# Patient Record
Sex: Male | Born: 1935 | Hispanic: Yes | Smoking: Former smoker
Health system: Southern US, Community
[De-identification: ages and names within clinical notes are randomized; demographics above are authoritative.]

## PROBLEM LIST (undated history)

## (undated) DIAGNOSIS — I10 Essential (primary) hypertension: Secondary | ICD-10-CM

## (undated) DIAGNOSIS — E785 Hyperlipidemia, unspecified: Secondary | ICD-10-CM

## (undated) DIAGNOSIS — I739 Peripheral vascular disease, unspecified: Secondary | ICD-10-CM

## (undated) DIAGNOSIS — Z8719 Personal history of other diseases of the digestive system: Secondary | ICD-10-CM

## (undated) HISTORY — PX: HERNIA REPAIR: SHX51

## (undated) HISTORY — DX: Essential (primary) hypertension: I10

## (undated) HISTORY — DX: Peripheral vascular disease, unspecified: I73.9

## (undated) HISTORY — PX: OTHER SURGICAL HISTORY: SHX169

## (undated) HISTORY — DX: Personal history of other diseases of the digestive system: Z87.19

## (undated) HISTORY — DX: Hyperlipidemia, unspecified: E78.5

---

## 2010-05-30 ENCOUNTER — Emergency Department: Payer: Self-pay | Admitting: Emergency Medicine

## 2011-10-16 ENCOUNTER — Ambulatory Visit: Payer: Self-pay | Admitting: Ophthalmology

## 2011-11-08 ENCOUNTER — Ambulatory Visit: Payer: Self-pay | Admitting: Vascular Surgery

## 2011-11-08 LAB — BASIC METABOLIC PANEL
Calcium, Total: 9.4 mg/dL (ref 8.5–10.1)
Chloride: 104 mmol/L (ref 98–107)
Co2: 23 mmol/L (ref 21–32)
EGFR (African American): 52 — ABNORMAL LOW
EGFR (Non-African Amer.): 45 — ABNORMAL LOW
Glucose: 99 mg/dL (ref 65–99)
Osmolality: 280 (ref 275–301)
Potassium: 4.7 mmol/L (ref 3.5–5.1)
Sodium: 137 mmol/L (ref 136–145)

## 2011-11-08 LAB — CBC
HCT: 36.8 % — ABNORMAL LOW (ref 40.0–52.0)
MCV: 91 fL (ref 80–100)
RBC: 4.03 10*6/uL — ABNORMAL LOW (ref 4.40–5.90)
RDW: 14 % (ref 11.5–14.5)
WBC: 6.4 10*3/uL (ref 3.8–10.6)

## 2011-11-12 ENCOUNTER — Ambulatory Visit: Payer: Self-pay | Admitting: Vascular Surgery

## 2011-11-14 ENCOUNTER — Inpatient Hospital Stay: Payer: Self-pay | Admitting: Physician Assistant

## 2011-11-15 LAB — CBC WITH DIFFERENTIAL/PLATELET
Basophil #: 0 10*3/uL (ref 0.0–0.1)
Basophil %: 0.6 %
Eosinophil %: 1.1 %
HCT: 30.1 % — ABNORMAL LOW (ref 40.0–52.0)
Lymphocyte #: 0.8 10*3/uL — ABNORMAL LOW (ref 1.0–3.6)
Lymphocyte %: 12.8 %
MCV: 92 fL (ref 80–100)
Monocyte %: 12.6 %
Neutrophil %: 72.9 %
Platelet: 140 10*3/uL — ABNORMAL LOW (ref 150–440)
RBC: 3.28 10*6/uL — ABNORMAL LOW (ref 4.40–5.90)
RDW: 13.8 % (ref 11.5–14.5)
WBC: 6.1 10*3/uL (ref 3.8–10.6)

## 2011-11-15 LAB — BASIC METABOLIC PANEL
BUN: 14 mg/dL (ref 7–18)
Calcium, Total: 8.4 mg/dL — ABNORMAL LOW (ref 8.5–10.1)
Creatinine: 1.17 mg/dL (ref 0.60–1.30)
EGFR (African American): >60
EGFR (Non-African Amer.): 59 — ABNORMAL LOW
Glucose: 123 mg/dL — ABNORMAL HIGH (ref 65–99)
Osmolality: 279 (ref 275–301)
Potassium: 4.3 mmol/L (ref 3.5–5.1)
Sodium: 139 mmol/L (ref 136–145)

## 2011-11-15 LAB — APTT: Activated PTT: 38.3 secs — ABNORMAL HIGH (ref 23.6–35.9)

## 2011-11-15 LAB — PATHOLOGY REPORT

## 2011-11-15 LAB — PROTIME-INR: Prothrombin Time: 15.5 secs — ABNORMAL HIGH (ref 11.5–14.7)

## 2012-06-04 ENCOUNTER — Ambulatory Visit: Payer: Self-pay | Admitting: Family Medicine

## 2012-12-13 ENCOUNTER — Emergency Department: Payer: Self-pay | Admitting: Internal Medicine

## 2012-12-13 LAB — COMPREHENSIVE METABOLIC PANEL
Anion Gap: 6 — ABNORMAL LOW (ref 7–16)
BUN: 11 mg/dL (ref 7–18)
Bilirubin,Total: 0.6 mg/dL (ref 0.2–1.0)
Calcium, Total: 8.9 mg/dL (ref 8.5–10.1)
Co2: 24 mmol/L (ref 21–32)
Creatinine: 1.25 mg/dL (ref 0.60–1.30)
EGFR (African American): >60
Glucose: 103 mg/dL — ABNORMAL HIGH (ref 65–99)
SGOT(AST): 47 U/L — ABNORMAL HIGH (ref 15–37)
SGPT (ALT): 50 U/L (ref 12–78)
Sodium: 136 mmol/L (ref 136–145)

## 2012-12-13 LAB — CBC
HGB: 13 g/dL (ref 13.0–18.0)
MCH: 30.1 pg (ref 26.0–34.0)
MCHC: 33.6 g/dL (ref 32.0–36.0)
MCV: 90 fL (ref 80–100)
Platelet: 127 10*3/uL — ABNORMAL LOW (ref 150–440)
RBC: 4.3 10*6/uL — ABNORMAL LOW (ref 4.40–5.90)

## 2012-12-13 LAB — TROPONIN I: Troponin-I: <0.02 ng/mL

## 2013-02-23 ENCOUNTER — Inpatient Hospital Stay: Payer: Self-pay | Admitting: Vascular Surgery

## 2013-02-23 LAB — CBC WITH DIFFERENTIAL/PLATELET
Basophil #: 0.1 10*3/uL (ref 0.0–0.1)
Basophil %: 1.1 %
Basophil %: 1 %
Eosinophil #: 0.1 10*3/uL (ref 0.0–0.7)
Eosinophil #: 0.1 10*3/uL (ref 0.0–0.7)
Eosinophil %: 1.8 %
Eosinophil %: 1.7 %
HCT: 37.9 % — ABNORMAL LOW (ref 40.0–52.0)
HCT: 34.8 % — ABNORMAL LOW (ref 40.0–52.0)
HGB: 12.7 g/dL — ABNORMAL LOW (ref 13.0–18.0)
Lymphocyte #: 0.8 10*3/uL — ABNORMAL LOW (ref 1.0–3.6)
Lymphocyte %: 15.8 %
MCHC: 33.6 g/dL (ref 32.0–36.0)
MCV: 91 fL (ref 80–100)
MCV: 91 fL (ref 80–100)
Monocyte #: 0.7 x10 3/mm (ref 0.2–1.0)
Monocyte #: 0.5 x10 3/mm (ref 0.2–1.0)
Monocyte %: 11.9 %
Monocyte %: 7.9 %
Neutrophil #: 4.9 10*3/uL (ref 1.4–6.5)
Neutrophil %: 76.7 %
RBC: 3.82 10*6/uL — ABNORMAL LOW (ref 4.40–5.90)
RDW: 15.4 % — ABNORMAL HIGH (ref 11.5–14.5)
WBC: 6.4 10*3/uL (ref 3.8–10.6)

## 2013-02-23 LAB — BASIC METABOLIC PANEL
Anion Gap: 6 — ABNORMAL LOW (ref 7–16)
BUN: 23 mg/dL — ABNORMAL HIGH (ref 7–18)
Calcium, Total: 9.2 mg/dL (ref 8.5–10.1)
Co2: 24 mmol/L (ref 21–32)
Creatinine: 1.4 mg/dL — ABNORMAL HIGH (ref 0.60–1.30)
EGFR (Non-African Amer.): 47 — ABNORMAL LOW
Glucose: 104 mg/dL — ABNORMAL HIGH (ref 65–99)
Osmolality: 278 (ref 275–301)
Sodium: 137 mmol/L (ref 136–145)

## 2013-02-23 LAB — PROTIME-INR
INR: 1.2
Prothrombin Time: 15.3 secs — ABNORMAL HIGH (ref 11.5–14.7)

## 2013-02-23 LAB — FIBRINOGEN: Fibrinogen: 490 mg/dL — ABNORMAL HIGH (ref 210–470)

## 2013-02-23 LAB — APTT: Activated PTT: 37.7 secs — ABNORMAL HIGH (ref 23.6–35.9)

## 2013-02-24 LAB — CBC WITH DIFFERENTIAL/PLATELET
Basophil #: 0 10*3/uL (ref 0.0–0.1)
Basophil #: 0 10*3/uL (ref 0.0–0.1)
Basophil %: 0.6 %
Basophil %: 0.6 %
Eosinophil #: 0 10*3/uL (ref 0.0–0.7)
Eosinophil #: 0.1 10*3/uL (ref 0.0–0.7)
Eosinophil %: 1.2 %
HCT: 33.4 % — ABNORMAL LOW (ref 40.0–52.0)
HCT: 33 % — ABNORMAL LOW (ref 40.0–52.0)
Lymphocyte #: 0.5 10*3/uL — ABNORMAL LOW (ref 1.0–3.6)
Lymphocyte #: 0.7 10*3/uL — ABNORMAL LOW (ref 1.0–3.6)
Lymphocyte %: 8.3 %
MCH: 30.7 pg (ref 26.0–34.0)
MCHC: 34.3 g/dL (ref 32.0–36.0)
MCV: 91 fL (ref 80–100)
Monocyte #: 0.5 x10 3/mm (ref 0.2–1.0)
Monocyte %: 7.3 %
Neutrophil #: 5 10*3/uL (ref 1.4–6.5)
Neutrophil #: 5.1 10*3/uL (ref 1.4–6.5)
Neutrophil %: 83.3 %
Platelet: 116 10*3/uL — ABNORMAL LOW (ref 150–440)
RBC: 3.68 10*6/uL — ABNORMAL LOW (ref 4.40–5.90)
RBC: 3.69 10*6/uL — ABNORMAL LOW (ref 4.40–5.90)
RDW: 15.2 % — ABNORMAL HIGH (ref 11.5–14.5)
WBC: 6.1 10*3/uL (ref 3.8–10.6)

## 2013-02-24 LAB — BASIC METABOLIC PANEL
BUN: 20 mg/dL — ABNORMAL HIGH (ref 7–18)
Calcium, Total: 8.3 mg/dL — ABNORMAL LOW (ref 8.5–10.1)
Co2: 23 mmol/L (ref 21–32)
Creatinine: 1.13 mg/dL (ref 0.60–1.30)
EGFR (Non-African Amer.): >60
Glucose: 124 mg/dL — ABNORMAL HIGH (ref 65–99)

## 2013-02-24 LAB — HEMOGLOBIN
HGB: 10.3 g/dL — ABNORMAL LOW (ref 13.0–18.0)
HGB: 9.9 g/dL — ABNORMAL LOW (ref 13.0–18.0)

## 2013-02-25 LAB — CBC WITH DIFFERENTIAL/PLATELET
Basophil #: 0 10*3/uL (ref 0.0–0.1)
Basophil %: 0.6 %
Eosinophil #: 0.1 10*3/uL (ref 0.0–0.7)
HGB: 9.5 g/dL — ABNORMAL LOW (ref 13.0–18.0)
Lymphocyte %: 14.2 %
MCH: 30.7 pg (ref 26.0–34.0)
MCHC: 34.2 g/dL (ref 32.0–36.0)
Monocyte #: 0.7 x10 3/mm (ref 0.2–1.0)
Neutrophil %: 72.2 %
Platelet: 102 10*3/uL — ABNORMAL LOW (ref 150–440)

## 2013-02-25 LAB — BASIC METABOLIC PANEL
Anion Gap: 6 — ABNORMAL LOW (ref 7–16)
BUN: 16 mg/dL (ref 7–18)
Chloride: 107 mmol/L (ref 98–107)
Co2: 24 mmol/L (ref 21–32)
Creatinine: 1.14 mg/dL (ref 0.60–1.30)
EGFR (Non-African Amer.): 60 — ABNORMAL LOW
Glucose: 101 mg/dL — ABNORMAL HIGH (ref 65–99)
Osmolality: 275 (ref 275–301)
Sodium: 137 mmol/L (ref 136–145)

## 2013-02-25 LAB — HEMOGLOBIN
HGB: 9.5 g/dL — ABNORMAL LOW (ref 13.0–18.0)
HGB: 9.4 g/dL — ABNORMAL LOW (ref 13.0–18.0)

## 2013-02-26 LAB — BASIC METABOLIC PANEL
Anion Gap: 4 — ABNORMAL LOW (ref 7–16)
Calcium, Total: 8.2 mg/dL — ABNORMAL LOW (ref 8.5–10.1)
EGFR (African American): 59 — ABNORMAL LOW
Glucose: 115 mg/dL — ABNORMAL HIGH (ref 65–99)
Osmolality: 274 (ref 275–301)
Potassium: 4 mmol/L (ref 3.5–5.1)
Sodium: 135 mmol/L — ABNORMAL LOW (ref 136–145)

## 2013-02-26 LAB — CBC WITH DIFFERENTIAL/PLATELET
Basophil %: 0.8 %
Eosinophil %: 3 %
HCT: 25.6 % — ABNORMAL LOW (ref 40.0–52.0)
HGB: 8.7 g/dL — ABNORMAL LOW (ref 13.0–18.0)
Lymphocyte #: 1.1 10*3/uL (ref 1.0–3.6)
Lymphocyte %: 17.8 %
MCH: 30.7 pg (ref 26.0–34.0)
MCHC: 34 g/dL (ref 32.0–36.0)
MCV: 90 fL (ref 80–100)
Monocyte #: 0.8 x10 3/mm (ref 0.2–1.0)
Monocyte %: 12 %
Neutrophil %: 66.4 %
Platelet: 110 10*3/uL — ABNORMAL LOW (ref 150–440)
RBC: 2.83 10*6/uL — ABNORMAL LOW (ref 4.40–5.90)
RDW: 15.5 % — ABNORMAL HIGH (ref 11.5–14.5)
WBC: 6.3 10*3/uL (ref 3.8–10.6)

## 2014-02-28 ENCOUNTER — Ambulatory Visit: Payer: Self-pay

## 2014-02-28 ENCOUNTER — Inpatient Hospital Stay: Payer: Self-pay | Admitting: Family Medicine

## 2014-02-28 LAB — PROTIME-INR
INR: 2.6
Prothrombin Time: 26.8 secs — ABNORMAL HIGH (ref 11.5–14.7)

## 2014-02-28 LAB — COMPREHENSIVE METABOLIC PANEL
AST: 38 U/L — AB (ref 15–37)
Albumin: 3.7 g/dL (ref 3.4–5.0)
Alkaline Phosphatase: 145 U/L — ABNORMAL HIGH
Anion Gap: 11 (ref 7–16)
BUN: 39 mg/dL — ABNORMAL HIGH (ref 7–18)
Bilirubin,Total: 0.6 mg/dL (ref 0.2–1.0)
CHLORIDE: 105 mmol/L (ref 98–107)
CREATININE: 1.68 mg/dL — AB (ref 0.60–1.30)
Calcium, Total: 8.5 mg/dL (ref 8.5–10.1)
Co2: 23 mmol/L (ref 21–32)
EGFR (Non-African Amer.): 42 — ABNORMAL LOW
GFR CALC AF AMER: 51 — AB
Glucose: 115 mg/dL — ABNORMAL HIGH (ref 65–99)
Osmolality: 288 (ref 275–301)
POTASSIUM: 4 mmol/L (ref 3.5–5.1)
SGPT (ALT): 35 U/L
Sodium: 139 mmol/L (ref 136–145)
Total Protein: 8 g/dL (ref 6.4–8.2)

## 2014-02-28 LAB — CBC
HCT: 36.1 % — ABNORMAL LOW (ref 40.0–52.0)
HGB: 11.9 g/dL — AB (ref 13.0–18.0)
MCH: 30.3 pg (ref 26.0–34.0)
MCHC: 33 g/dL (ref 32.0–36.0)
MCV: 92 fL (ref 80–100)
Platelet: 186 10*3/uL (ref 150–440)
RBC: 3.93 10*6/uL — ABNORMAL LOW (ref 4.40–5.90)
RDW: 15.1 % — AB (ref 11.5–14.5)
WBC: 5.5 10*3/uL (ref 3.8–10.6)

## 2014-02-28 LAB — HEPARIN LEVEL (UNFRACTIONATED): Anti-Xa(Unfractionated): >1.1 IU/mL (ref 0.30–0.70)

## 2014-02-28 LAB — CK TOTAL AND CKMB (NOT AT ARMC)
CK, TOTAL: 162 U/L (ref 39–308)
CK-MB: 2.3 ng/mL (ref 0.5–3.6)

## 2014-02-28 LAB — APTT: Activated PTT: 51.1 secs — ABNORMAL HIGH (ref 23.6–35.9)

## 2014-02-28 LAB — PRO B NATRIURETIC PEPTIDE: B-Type Natriuretic Peptide: 373 pg/mL (ref 0–450)

## 2014-02-28 LAB — TROPONIN I: Troponin-I: <0.02 ng/mL

## 2014-03-01 LAB — BASIC METABOLIC PANEL
Anion Gap: 8 (ref 7–16)
BUN: 22 mg/dL — ABNORMAL HIGH (ref 7–18)
Calcium, Total: 8.6 mg/dL (ref 8.5–10.1)
Chloride: 110 mmol/L — ABNORMAL HIGH (ref 98–107)
Co2: 22 mmol/L (ref 21–32)
Creatinine: 1.19 mg/dL (ref 0.60–1.30)
EGFR (African American): >60
EGFR (Non-African Amer.): >60
Glucose: 109 mg/dL — ABNORMAL HIGH (ref 65–99)
Osmolality: 283 (ref 275–301)
Potassium: 4.3 mmol/L (ref 3.5–5.1)
Sodium: 140 mmol/L (ref 136–145)

## 2014-03-01 LAB — CBC WITH DIFFERENTIAL/PLATELET
BASOS ABS: 0 10*3/uL (ref 0.0–0.1)
BASOS PCT: 0.2 %
EOS PCT: 0.1 %
Eosinophil #: 0 10*3/uL (ref 0.0–0.7)
HCT: 37.3 % — ABNORMAL LOW (ref 40.0–52.0)
HGB: 12.3 g/dL — ABNORMAL LOW (ref 13.0–18.0)
LYMPHS PCT: 4.8 %
Lymphocyte #: 0.5 10*3/uL — ABNORMAL LOW (ref 1.0–3.6)
MCH: 30.3 pg (ref 26.0–34.0)
MCHC: 33.1 g/dL (ref 32.0–36.0)
MCV: 92 fL (ref 80–100)
Monocyte #: 0.9 x10 3/mm (ref 0.2–1.0)
Monocyte %: 9.5 %
NEUTROS PCT: 85.4 %
Neutrophil #: 8.1 10*3/uL — ABNORMAL HIGH (ref 1.4–6.5)
PLATELETS: 180 10*3/uL (ref 150–440)
RBC: 4.08 10*6/uL — AB (ref 4.40–5.90)
RDW: 15.1 % — ABNORMAL HIGH (ref 11.5–14.5)
WBC: 9.5 10*3/uL (ref 3.8–10.6)

## 2014-03-01 LAB — LIPID PANEL
Cholesterol: 117 mg/dL (ref 0–200)
HDL Cholesterol: 42 mg/dL (ref 40–60)
Ldl Cholesterol, Calc: 66 mg/dL (ref 0–100)
Triglycerides: 44 mg/dL (ref 0–200)
VLDL Cholesterol, Calc: 9 mg/dL (ref 5–40)

## 2014-03-01 LAB — HEMOGLOBIN: HGB: 12 g/dL — ABNORMAL LOW (ref 13.0–18.0)

## 2014-03-01 LAB — APTT
Activated PTT: 75.9 secs — ABNORMAL HIGH (ref 23.6–35.9)
Activated PTT: 151.1 secs — ABNORMAL HIGH (ref 23.6–35.9)

## 2014-03-01 LAB — PROTIME-INR
INR: 1.4
Prothrombin Time: 16.9 secs — ABNORMAL HIGH (ref 11.5–14.7)

## 2014-03-01 LAB — HEPARIN LEVEL (UNFRACTIONATED): Anti-Xa(Unfractionated): >1.1 IU/mL (ref 0.30–0.70)

## 2014-03-01 LAB — PLATELET COUNT: Platelet: 187 10*3/uL (ref 150–440)

## 2014-03-01 LAB — FIBRINOGEN: Fibrinogen: 403 mg/dL (ref 210–470)

## 2014-03-02 LAB — CBC WITH DIFFERENTIAL/PLATELET
BASOS PCT: 0.6 %
Basophil #: 0 10*3/uL (ref 0.0–0.1)
Basophil #: 0 10*3/uL (ref 0.0–0.1)
Basophil %: 0.6 %
Eosinophil #: 0 10*3/uL (ref 0.0–0.7)
Eosinophil #: 0.1 10*3/uL (ref 0.0–0.7)
Eosinophil %: 0.2 %
Eosinophil %: 0.7 %
HCT: 34.3 % — ABNORMAL LOW (ref 40.0–52.0)
HCT: 36.4 % — ABNORMAL LOW (ref 40.0–52.0)
HGB: 11.4 g/dL — ABNORMAL LOW (ref 13.0–18.0)
HGB: 11.7 g/dL — AB (ref 13.0–18.0)
LYMPHS PCT: 6.5 %
Lymphocyte #: 0.5 10*3/uL — ABNORMAL LOW (ref 1.0–3.6)
Lymphocyte #: 0.7 10*3/uL — ABNORMAL LOW (ref 1.0–3.6)
Lymphocyte %: 8.7 %
MCH: 30.2 pg (ref 26.0–34.0)
MCH: 29.8 pg (ref 26.0–34.0)
MCHC: 33.3 g/dL (ref 32.0–36.0)
MCHC: 32.2 g/dL (ref 32.0–36.0)
MCV: 91 fL (ref 80–100)
MCV: 92 fL (ref 80–100)
MONO ABS: 0.8 x10 3/mm (ref 0.2–1.0)
MONOS PCT: 10 %
Monocyte #: 0.8 x10 3/mm (ref 0.2–1.0)
Monocyte %: 10.2 %
NEUTROS PCT: 82.7 %
Neutrophil #: 6.7 10*3/uL — ABNORMAL HIGH (ref 1.4–6.5)
Neutrophil #: 6 10*3/uL (ref 1.4–6.5)
Neutrophil %: 79.8 %
PLATELETS: 145 10*3/uL — AB (ref 150–440)
Platelet: 151 10*3/uL (ref 150–440)
RBC: 3.78 10*6/uL — ABNORMAL LOW (ref 4.40–5.90)
RBC: 3.94 10*6/uL — AB (ref 4.40–5.90)
RDW: 15.2 % — AB (ref 11.5–14.5)
RDW: 15.2 % — AB (ref 11.5–14.5)
WBC: 8.1 10*3/uL (ref 3.8–10.6)
WBC: 7.5 10*3/uL (ref 3.8–10.6)

## 2014-03-02 LAB — BASIC METABOLIC PANEL
Anion Gap: 8 (ref 7–16)
BUN: 16 mg/dL (ref 7–18)
CREATININE: 1.09 mg/dL (ref 0.60–1.30)
Calcium, Total: 8.5 mg/dL (ref 8.5–10.1)
Chloride: 106 mmol/L (ref 98–107)
Co2: 23 mmol/L (ref 21–32)
EGFR (African American): >60
EGFR (Non-African Amer.): >60
Glucose: 117 mg/dL — ABNORMAL HIGH (ref 65–99)
Osmolality: 276 (ref 275–301)
Potassium: 4.2 mmol/L (ref 3.5–5.1)
SODIUM: 137 mmol/L (ref 136–145)

## 2014-03-03 LAB — CBC WITH DIFFERENTIAL/PLATELET
Basophil #: 0 10*3/uL (ref 0.0–0.1)
Basophil %: 0.8 %
EOS ABS: 0.1 10*3/uL (ref 0.0–0.7)
EOS PCT: 2 %
HCT: 30.7 % — ABNORMAL LOW (ref 40.0–52.0)
HGB: 10.1 g/dL — AB (ref 13.0–18.0)
Lymphocyte #: 0.8 10*3/uL — ABNORMAL LOW (ref 1.0–3.6)
Lymphocyte %: 13.4 %
MCH: 30.1 pg (ref 26.0–34.0)
MCHC: 33 g/dL (ref 32.0–36.0)
MCV: 91 fL (ref 80–100)
MONOS PCT: 14.1 %
Monocyte #: 0.9 x10 3/mm (ref 0.2–1.0)
Neutrophil #: 4.2 10*3/uL (ref 1.4–6.5)
Neutrophil %: 69.7 %
PLATELETS: 128 10*3/uL — AB (ref 150–440)
RBC: 3.36 10*6/uL — ABNORMAL LOW (ref 4.40–5.90)
RDW: 15.1 % — ABNORMAL HIGH (ref 11.5–14.5)
WBC: 6.1 10*3/uL (ref 3.8–10.6)

## 2014-03-03 LAB — BASIC METABOLIC PANEL
ANION GAP: 8 (ref 7–16)
BUN: 19 mg/dL — ABNORMAL HIGH (ref 7–18)
CHLORIDE: 113 mmol/L — AB (ref 98–107)
CO2: 21 mmol/L (ref 21–32)
Calcium, Total: 7.9 mg/dL — ABNORMAL LOW (ref 8.5–10.1)
Creatinine: 1.27 mg/dL (ref 0.60–1.30)
GFR CALC NON AF AMER: 58 — AB
GLUCOSE: 104 mg/dL — AB (ref 65–99)
Osmolality: 286 (ref 275–301)
POTASSIUM: 3.8 mmol/L (ref 3.5–5.1)
Sodium: 142 mmol/L (ref 136–145)

## 2014-07-11 ENCOUNTER — Emergency Department
Admit: 2014-07-11 | Disposition: A | Discharge status: Other Acute Inpatient Hospital | Payer: Self-pay | Admitting: Emergency Medicine

## 2014-07-11 LAB — CBC WITH DIFFERENTIAL/PLATELET
BASOS PCT: 0.3 %
Basophil #: 0 10*3/uL (ref 0.0–0.1)
EOS PCT: 0.3 %
Eosinophil #: 0 10*3/uL (ref 0.0–0.7)
HCT: 17 % — ABNORMAL LOW (ref 40.0–52.0)
HGB: 5.3 g/dL — ABNORMAL LOW (ref 13.0–18.0)
LYMPHS PCT: 18.3 %
Lymphocyte #: 1.4 10*3/uL (ref 1.0–3.6)
MCH: 29.4 pg (ref 26.0–34.0)
MCHC: 31.1 g/dL — ABNORMAL LOW (ref 32.0–36.0)
MCV: 94 fL (ref 80–100)
MONO ABS: 0.3 x10 3/mm (ref 0.2–1.0)
Monocyte %: 4.2 %
Neutrophil #: 5.9 10*3/uL (ref 1.4–6.5)
Neutrophil %: 76.9 %
Platelet: 145 10*3/uL — ABNORMAL LOW (ref 150–440)
RBC: 1.8 10*6/uL — ABNORMAL LOW (ref 4.40–5.90)
RDW: 15.6 % — AB (ref 11.5–14.5)
WBC: 7.6 10*3/uL (ref 3.8–10.6)

## 2014-07-11 LAB — URINALYSIS, COMPLETE
Bacteria: NONE SEEN
Bilirubin,UR: NEGATIVE
GLUCOSE, UR: NEGATIVE mg/dL (ref 0–75)
Hyaline Cast: 2
Ketone: NEGATIVE
Leukocyte Esterase: NEGATIVE
Nitrite: NEGATIVE
PH: 5 (ref 4.5–8.0)
Protein: NEGATIVE
Specific Gravity: 1.01 (ref 1.003–1.030)
WBC UR: 8 /HPF (ref 0–5)

## 2014-07-11 LAB — COMPREHENSIVE METABOLIC PANEL
ALBUMIN: 3 g/dL — AB
ALK PHOS: 61 U/L
AST: 38 U/L
Anion Gap: 12 (ref 7–16)
BILIRUBIN TOTAL: 0.4 mg/dL
BUN: 91 mg/dL — AB
CREATININE: 3.11 mg/dL — AB
Calcium, Total: 8.3 mg/dL — ABNORMAL LOW
Chloride: 110 mmol/L
Co2: 11 mmol/L — ABNORMAL LOW
EGFR (African American): 21 — ABNORMAL LOW
EGFR (Non-African Amer.): 18 — ABNORMAL LOW
Glucose: 256 mg/dL — ABNORMAL HIGH
Potassium: 4.8 mmol/L
SGPT (ALT): 15 U/L — ABNORMAL LOW
Sodium: 133 mmol/L — ABNORMAL LOW
TOTAL PROTEIN: 5.7 g/dL — AB

## 2014-07-11 LAB — PROTIME-INR
INR: 1.5
Prothrombin Time: 18.6 secs — ABNORMAL HIGH

## 2014-07-11 LAB — APTT: Activated PTT: 31.3 secs (ref 23.6–35.9)

## 2014-07-11 LAB — TROPONIN I: Troponin-I: 0.07 ng/mL — ABNORMAL HIGH

## 2014-07-27 NOTE — Op Note (Signed)
PATIENT NAME:  Devin Cooley, Devin Cooley MR#:  [id number] DATE OF BIRTH:  05-14-1929  DATE OF PROCEDURE:  11/14/2011  PREOPERATIVE DIAGNOSES:  1. High-grade left carotid artery stenosis.  2. Amaurosis fugax.  3. Hypertension.   POSTOPERATIVE DIAGNOSES:  1. High-grade left carotid artery stenosis.  2. Amaurosis fugax.  3. Hypertension.   PROCEDURES: Left carotid endarterectomy with arterial reconstruction using the CoreMatrix system.   SURGEON: Annice Needy, M.D.   ANESTHESIA: General.   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  He was found to have a very high-grade left carotid artery stenosis in the 90 plus percent range and it was recommended he have revascularization for repair. Risks and benefits were discussed and informed consent was obtained.   DESCRIPTION OF PROCEDURE: The patient was brought to the operative suite and, after an adequate level of general anesthesia was obtained, a roll was placed under the shoulders and the neck was flexed and turned the side. His neck and chest were sterilely prepped and draped and a sterile surgical field was created, and he was placed in the modified beach chair position. An incision was created along the anterior border of the sternocleidomastoid and we dissected down through platysma with electrocautery. The sternocleidomastoid was retracted laterally and the facial vein was identified, ligated, and divided between silk ties. Our preoperative imaging demonstrated a very long lesion with the tightest portion of the stenosis in the common carotid artery well below the bifurcation, but with disease and stenosis that progressed a few centimeters above the carotid bifurcation. The artery was dissected out and encircled with vessel loops proximally and distally. The external carotid artery and  superior thyroid artery were dissected out. The patient was given 6000 units of intravenous heparin for systemic anticoagulation. Control was pulled up on the vessel loops. An anterior wall arteriotomy was created with an 11 blade and extended with Potts scissors. A Pruitt-Inahara shunt was placed first in the internal carotid artery, flushed and deaired, and then in the common carotid artery, and flow was restored in approximately two minutes. The endarterectomy was performed in the typical fashion. A proximal endpoint was cut flush with tenotomy scissors. An eversion endarterectomy was performed on the external carotid artery and a distal endpoint was created with a nice feathered endpoint with gentle traction. Three 7-0 Prolene tacking sutures were used to tack down the distal endpoint. The vessel was copiously heparinized locally. The CoreMatrix extracellular matrix was selected to reconstruct the arterial defect. This was cut and beveled distally and a 6-0 Prolene suture was started at the distal endpoint and run approximately one half the length of the arteriotomy. The CoreMatrix was then cut and beveled proximally which required almost the entirety of the graft due to the long nature of the lesion. A second 6-0 Prolene was started at the proximal endpoint and the medial suture line was tied together. The lateral suture line was run to approximately one-quarter the length of the arteriotomy and the Pruitt-Inahara shunt was then removed. The vessel was flushed and deaired from the external, internal and common carotid arteries and then the arteriotomy was completed. The vessel was flushed in the external carotid artery for several cardiac cycles prior to release of control. On release of control, several 6-0 Prolene patch sutures were used for hemostasis and hemostasis was achieved. The wound was  irrigated. Surgicel and Evicyl topical hemostatic agents were placed and hemostasis was achieved. The wound was then  closed with four interrupted 3-0 Vicryl sutures in the sternocleidomastoid space. The platysma was closed with a running 3-0 Vicryl, and the skin was closed with a 4-0 Monocryl. A sterile dressing was placed. The patient was then awakened from anesthesia and taken to the recovery room in stable condition.  ____________________________ Annice NeedyJason S. Dessie Delcarlo, MD jsd:slb D: 11/14/2011 11:02:18 ET     T: 11/14/2011 11:26:01 ET        JOB#: [id number] cc: Annice NeedyJason S. Devin Ziemba, MD, <Dictator> Devin IvanKanhka Linthavong, MD Annice NeedyJASON S Devin Gawlik MD ELECTRONICALLY SIGNED 11/19/2011 9:04

## 2014-07-30 NOTE — Op Note (Signed)
PATIENT NAME:  Devin Cooley, Devin Cooley MR#:  [id number] DATE OF BIRTH:  1929/12/07  DATE OF PROCEDURE:  02/24/2013  PREOPERATIVE DIAGNOSES: 1. Ischemia of left lower extremity.  2. Thrombosis of left common femoral artery, profunda femoris artery and a left femoral popliteal bypass graft.   POSTOPERATIVE DIAGNOSES:  1. Ischemia of left lower extremity.  2. Thrombosis of left common femoral artery, profunda femoris artery and a left femoral popliteal bypass graft.   PROCEDURE  PERFORMED:  1. Follow-up angiography existing lysis catheter.  2. Additional third order catheter placement into posterior tibial artery on the left.  3. Additional third order catheter placement into profunda femoris artery on the left.   4. Percutaneous transluminal angioplasty to 3 mm, left posterior tibial and tibioperoneal trunk.  5. Percutaneous transluminal angioplasty to 4 mm distal anastomosis left femoral popliteal bypass graft.  6. Percutaneous transluminal angioplasty of the left profunda femoris artery to 6 mm using a Lutonix balloon.  7. Mechanical thrombectomy using the AngioJet, left femoral popliteal bypass graft.  8. StarClose closure, right common femoral artery.   SURGEON: Levora Dredge, MD   SEDATION: Versed 5 mg plus fentanyl 200 mcg administered IV. Continuous ECG, pulse oximetry and cardiopulmonary monitoring is performed throughout the entire procedure by the interventional radiology nurse. Total sedation time was one hour, 30 minutes.   ACCESS: Existing 6 French sheath, right common femoral artery.   FLUOROSCOPY TIME: 9.6 minutes.   CONTRAST USED: Isovue 50 mL.   INDICATIONS: [name] is an 81 year old gentleman who presents to the Emergency Room with a cold leg which been ongoing for several days. Why the patient did not come to the Emergency Room or contact the office when his leg became acutely painful several days ago is unknown to me. Risks and benefits for attempted limb salvage to  prevent amputation were discussed with the patient. He has agreed to proceed. Lysis therapy was initiated yesterday. He now returns to the special procedure suite for follow-up angiography and intervention.   DESCRIPTION OF PROCEDURE: The patient is positioned supine on the table and after adequate sedation his right groin and the catheter system was prepped and draped in a sterile fashion. Appropriate timeout is called.   Obturator wire is removed from the infusion catheter and a 260 Magic torque wire is advanced through the catheter. The catheter is removed. The sheath is positioned in the proximal common femoral and hand injection of contrast is utilized to demonstrate the common femoral as well as the femoral bifurcation and graft anastomosis in an LAO projection. This shows that the common femoral is now patent. There is a 75% to 80% stenosis within the main trunk of the profunda femoris. There is patency of the proximal anastomosis, but very slow flow through the graft itself.   The catheter is then advanced over the wire into the graft itself and distal runoff is obtained with selective injection of the graft. This demonstrates a 60% stenosis at the distal anastomosis. There is no visualized tibial vessel runoff. There is occlusion of the popliteal at the level of the tibial plateau.   A 150 straight slip catheter is then advanced over the Magic torque wire and the Magic torque wire is removed after the catheter is positioned in the distal popliteal  and magnified images are obtained. These again, do not visualize any tibial vessel. A 260 stiff angled Glidewire is then advanced through the catheter and appears to course into the posterior tibial. Wire then is negotiated down  to the level of the ankle and the catheter is tracked down distally. Distally there appears to be significant diffuse occlusive disease within the posterior tibial, however, as the catheter is pulled back and small puffs of  contrast are made, the posterior tibial is patent down to its distal one third approximately one handbreadth above the malleolus is where the tibial disease appears most profound. There is also a high-grade stenosis at the origin of the posterior tibial and occlusion within the tibioperoneal trunk.   A 3 x 6 balloon is then advanced over an 018 wire which was introduced through the straight catheter and the catheter removed. Inflation is to 10 atmospheres for one minute. Following this, a 4 x 22 balloon is advanced beginning in the distal popliteal through the bypass graft, and, again inflation is to 10 atmospheres for one minute. Follow-up angiography demonstrates no flow through the graft at this time. Given the very poor outflow, I did not feel that further attempts at salvage were going to be helpful at least at the tibial level and turned my attention to the high-grade stricture in the profunda femoris, perhaps providing profunda inflow would allow for a stable limb.   KMP catheter was then advanced over the wire and the wire and catheter are negotiated into the profunda femoris. Steep oblique view of the left groin was then obtained in magnified images which highlighted the lesion. Subsequently, a 4 x 4 balloon was advanced through the lesion and inflated to 10 atmospheres for one minute. Follow-up angiography demonstrates significant residual stenosis, and a 6 x 6 Lutonix balloon was advanced across the profunda femoris lesion. Inflation was to 10 atmospheres for three full minutes. Follow-up angiography now demonstrates complete resolution of the profunda femoris lesion. There is actually such a nice match. There does not appear to be any residual stenosis and there is now dramatically improved flow into the profunda femoris.   Also of note, there was now very sluggish flow through the graft. I am uncertain as to why angioplasty of the profunda femoris would of had any effect; however, clearly, there  was some flow in the graft, although you never saw contrast reach the distal anastomosis. I therefore felt it was appropriate to attempt thrombectomy, perhaps some fresh clot had formed during the angioplasties, and therefore using a KMP catheter, the wire was negotiated into the graft and then down into the posterior tibial. The AngioJet was then prepped on the field, advanced over the wire and a suction aspiration was performed. Total of 190 mL was aspirated. Follow-up angiography from the common femoral now demonstrated flow throughout the entire graft with filling of the proximal popliteal. Several large collaterals  also filled. There is poor filling of the posterior tibial and tibial peroneal trunk. Nevertheless, having established some flow in the graft, I felt it appropriate to terminate the lytic therapy and have initiated Integrilin infusion for the next approximately 24 hours.   The sheath was then pulled into the right common femoral, oblique view obtained and a StarClose device deployed without difficulty. There were no immediate complications.   INTERPRETATION: Initial views now demonstrate that there is flow throughout the common femoral and profunda femoris. There is a high-grade stenosis greater than 75% to 80% within the profunda femoris proximally. There is very poor flow, which is quite sluggish through the graft itself. Distally, the graft appears to have a high-grade stenosis at its distal anastomosis, but more significantly the distal popliteal tibioperoneal trunk is not  visualized, as are all three tibials.   After taking catheter and wire down, the posterior tibial is identified. It does appear to be patent in its proximal two thirds. There does appear to be a high-grade stenosis within the tibioperoneal trunk and this is treated with angioplasty to 3 mm. The distal popliteal is treated with angioplasty 4 mm as is the distal anastomosis. Following a secondary mechanical thrombectomy  with the AngioJet there is patency, although it appears to be fairly compromised in its distal outflow.   Following angioplasty to maximum of 6 mm using a Lutonix balloon, there is wide patency with an excellent result of the profunda femoris.   SUMMARY: Successful recanalization of the common femoral and profunda femoris. There is now also flow in the femoral popliteal bypass graft but the outflow in the below-knee area is quite disadvantaged and I am doubtful that this will stay open. Nevertheless, given a good profunda femoris, he may have a viable limb. He will be maintained on Integrilin for the time being.   ____________________________ Renford DillsGregory G. Duffy Dantonio, MD ggs:sg D: 02/24/2013 09:48:48 ET T: 02/24/2013 10:13:16 ET JOB#: [id number]  cc: Renford DillsGregory G. Johntavius Shepard, MD, <Dictator> Renford DillsGREGORY G Macon Sandiford MD ELECTRONICALLY SIGNED 03/16/2013 17:17

## 2014-07-30 NOTE — Discharge Summary (Signed)
PATIENT NAME:  Devin Cooley, Ottavio MR#:  [id number] DATE OF BIRTH:  1929-05-14  DATE OF ADMISSION:  02/23/2013 DATE OF DISCHARGE:  02/26/2013  ADMITTING DIAGNOSES:  1.  Acute on chronic left lower extremity ischemia with rest pain.  2.  History of carotid artery disease. 3.  Hyperlipidemia. 4.  Hypertension.  DISCHARGE DIAGNOSES:  1.  Acute on chronic left lower extremity ischemia with rest pain.  2.  History of carotid artery disease. 3.  Hyperlipidemia. 4.  Hypertension.  HOSPITAL PROCEDURES:  1.  02/23/2013: Angiogram of the left leg with catheter-directed thrombolysis of tPA to left common femoral artery, femoral-popliteal bypass graft in popliteal artery, PTA distal anastomosis and popliteal artery, placement for overnight infusion of thrombolysis into left common femoral artery, femoral-to-popliteal bypass in the popliteal artery, placement of right jugular triple-lumen catheter.  2.  On 02/24/2013, angiogram was done with angioplasty to left posterior tibial and tibioperoneal trunk, distal anastomosis left femoral-popliteal bypass graft, left profunda femoris artery mechanical thrombectomy using AngioJet to the left femoral-popliteal bypass graft.   BRIEF HISTORY: The patient is an [age] year old male with history of left femoral-popliteal bypass and 2-day history of left lower extremity pain. He previously had claudication symptoms; however, this progressed to worsening left lower extremity pain. He presented with pulseless foot that was cool to touch. Two attempts at reperfusion, the procedures were performed after consent obtained.   HOSPITAL COURSE: The patient was admitted after thrombolysis and given overnight infusion of thrombolysis to the left lower extremity in the Critical Care Unit after angiogram on 02/23/2013. The following day, he was taken back to special procedures for second angiogram as listed above on 02/24/2013. He had significant improvement following this procedure to the  left lower extremity pain. He was on an Integrilin drip overnight, which was stopped the morning of 02/25/2013. Started to increase activity at this time. He was also started on Xarelto and aspirin was continued. The following day on 02/26/2013, we began ambulating more with assistance. He did have some difficulty initially; however, as the day went on he improved and was evaluated by physical therapy. He felt steady with a walker, and he lives with his son and daughter-in-law. He felt comfortable and ready to go home at this time.   DIET: Discharged on a low-fat, low-cholesterol diet.   ACTIVITY: No exertional activity.   MEDICATIONS: Hydrochlorothiazide, lisinopril, atorvastatin, Norco, Xarelto 20 mg daily and aspirin 81 mg daily.   FOLLOWUP: Home health will see. He was discharged with a walker. We will plan for a followup in 3 to 4 weeks. He was instructed to call or return in the interim with any issues or concerns.   ____________________________ Hoyle Sauerhelsea N. Demonte Dobratz, PA-C cnh:jcm D: 02/27/2013 11:19:37 ET T: 02/27/2013 11:43:25 ET JOB#: [id number]  cc: Hoyle Sauerhelsea N. Crickett Abbett, PA-C, <Dictator> Aerial Dilley N Rebekah Zackery PA ELECTRONICALLY SIGNED 03/11/2013 8:41

## 2014-07-30 NOTE — Consult Note (Signed)
CHIEF COMPLAINT and HISTORY:  Subjective/Chief Complaint left leg pain   History of Present Illness Patient presents with a two day history of left leg pain.  The pain has been severe since Saturday.  He had some pain with activity for a long time prior to this new pain.  No right leg symptoms.  No fever or heart palpitations.  In NSR today.  Does not have ulceration or infection. Has a long history of atherosclerosis present.  Had CEA in 2013.   PAST MEDICAL/SURGICAL HISTORY:  Past Medical History:   Atherosclerosis:    Hypercholesterolemia:    Kidney Insufficiency:    DVT:    HTN:    carotid endartectomy:   ALLERGIES:  Allergies:  No Known Allergies:   HOME MEDICATIONS:  Home Medications: Medication Instructions Status  hydrochlorothiazide-lisinopril 12.5 mg-20 mg oral tablet 1 tab(s) orally once a day Active  atorvastatin 40 mg oral tablet 1 tab(s) orally once a day (at bedtime) Active  Tylenol Caplet Extra Strength 500 mg oral tablet 2 tab(s) orally every 6 hours, As Needed Active   Family and Social History:  Family History Non-Contributory   Social History positive  tobacco (Current within 1 year), negative ETOH   Place of Living Home   Review of Systems:  Subjective/Chief Complaint left leg pain   Fever/Chills No   Cough No   Sputum No   Abdominal Pain No   Diarrhea No   Constipation No   Nausea/Vomiting No   SOB/DOE No   Chest Pain No   Telemetry Reviewed NSR   Dysuria No   Tolerating PT Yes   Tolerating Diet Yes   Medications/Allergies Reviewed Medications/Allergies reviewed   Physical Exam:  GEN well developed, well nourished   HEENT pink conjunctivae, poor dentition   NECK No masses  trachea midline   RESP normal resp effort  clear BS  no use of accessory muscles   CARD regular rate  no carotid bruits  no JVD   VASCULAR ACCESS none   ABD denies tenderness  normal BS   GU no superpubic tenderness   LYMPH positive  neck, negative axillae   EXTR negative edema, left foot cool to touch, cap refill sluggish on left.  Normal on right..  Right PT/DP pulses 1+, left pedal pulses not palpable.  Femoral pulse 2+ bilaterally.   SKIN No rashes, skin turgor good   NEURO cranial nerves intact, motor/sensory function intact   PSYCH alert, A+O to time, place, person   LABS:  Laboratory Results: Routine Hem:    17-Nov-14 13:52, CBC Profile  WBC (CBC) 5.9  RBC (CBC) 4.17  Hemoglobin (CBC) 12.7  Hematocrit (CBC) 37.9  Platelet Count (CBC) 173  MCV 91  MCH 30.5  MCHC 33.6  RDW 15.4  Neutrophil % 69.4  Lymphocyte % 15.8  Monocyte % 11.9  Eosinophil % 1.8  Basophil % 1.1  Neutrophil # 4.1  Lymphocyte # 0.9  Monocyte # 0.7  Eosinophil # 0.1  Basophil # 0.1  Result(s) reported on 23 Feb 2013 at 02:06PM.   RADIOLOGY:  Radiology Results: US:    06-Sep-14 15:17, US Color Flow Doppler Low Extrem Bilat (Legs)  US Color Flow Doppler Low Extrem Bilat (Legs)  REASON FOR EXAM:    hx dvt sob ; left leg pain  COMMENTS:       PROCEDURE: US  - US DOPPLER LOW EXTR BILATERAL  - Dec 13 2012  3:17PM     RESULT: Duplex Doppler interrogation  of the deep venous system of both   legs from the inguinal to the poplitealregion demonstrates the deep   venous systems are fully compressible throughout. The color Doppler and   spectral Doppler appearance is normal. There is normal response to distal   augmentation. The color Doppler images show no filling defect.    IMPRESSION:    1. No evidence of DVT in either lower extremity. The there is no   popliteal fossa cyst in either lower extremity.  Dictation Site: 6        Verified By: Elveria Royals, M.D., MD  LabUnknown:  PACS Image   ASSESSMENT AND PLAN:  Assessment/Admission Diagnosis left leg pain, pallor History of atherosclerotic disease Pain has been present for months, but worse over last 48 hours   Plan Suspect acute on chronic LLE ischemia with  rest pain. Foot is cool and discussed with patient and family that without revascularization limb loss is possible Plan to take to specials for angiogram with possible revascularization.   Anticoagulation can begin Patient and family voice their understanding and wish to proceed with attempt at revascularization   level 5   Electronic Signatures: Annice Needy (MD)  (Signed 17-Nov-14 14:15)  Authored: Chief Complaint and History, PAST MEDICAL/SURGICAL HISTORY, ALLERGIES, HOME MEDICATIONS, Family and Social History, Review of Systems, Physical Exam, LABS, RADIOLOGY, Assessment and Plan   Last Updated: 17-Nov-14 14:15 by Annice Needy (MD)

## 2014-07-30 NOTE — Op Note (Signed)
PATIENT NAME:  Devin Cooley, Devin Cooley MR#:  [id number] DATE OF BIRTH:  [birth year]  DATE OF PROCEDURE:  02/23/2013  PREOPERATIVE DIAGNOSES:  1.  Acute-on-chronic left lower extremity ischemia with rest pain. 2.  History of carotid artery disease.  3.  Hyperlipidemia.  4.  Hypertension.   POSTOPERATIVE DIAGNOSES:  1.  Acute-on-chronic left lower extremity ischemia with rest pain. 2.  History of carotid artery disease.  3.  Hyperlipidemia.  4.  Hypertension.  PROCEDURES:  1.  Catheter placement into left popliteal artery from right femoral approach.  2.  Aortogram and selective left lower extremity angiogram.  3.  Ultrasound guidance for vascular access, right femoral artery.  4.  Catheter-directed thrombolysis with 8 mg of tPA delivered with the AngioJet device to the left common femoral artery, femoral popliteal bypass graft, and popliteal artery.  5.  Percutaneous transluminal angioplasty of distal anastomosis and popliteal artery with 5 mm diameter angioplasty balloon.  6.  Placement for overnight infusion for thrombolysis into left common femoral artery, femoral to popliteal bypass and the popliteal artery.  7.  Ultrasound guidance for vascular access, right jugular vein.  8.  Placement of right jugular triple-lumen catheter, fluoroscopic guidance.   SURGEON: Annice Needy, M.D.   ANESTHESIA: Local with moderate conscious sedation.   ESTIMATED BLOOD LOSS: 25 mL.   INDICATION FOR PROCEDURE: This is an 81 year old Hispanic male with a 2-day history of left lower extremity pain. He has a history of femoral to popliteal bypass graft done at an outside institution 2 years ago. He had been having some claudication symptoms for weeks to months prior to his acute pain Saturday.   He presents with a pulseless foot which is cool and we are attempting to his perfusion to save his leg. The high risk of limb loss was discussed. Risks and benefits were discussed. Informed consent was obtained.    DESCRIPTION OF PROCEDURE: The patient is brought to the vascular  and interventional radiology suite. Groins were shaved and prepped and a sterile surgical field was created. The right femoral head was localized with fluoroscopy and the right femoral artery was visualized with ultrasound and accessed under direct ultrasound guidance without difficulty with a Seldinger needle. A J-wire and 5-French sheath were placed. Pigtail catheter was placed in the artery at the L1-L2 level and AP aortogram was performed. This showed patent renal arteries, patent aorta and iliac segments, although the flow in the left iliac artery was sluggish. I then crossed the aortic bifurcation and advanced to the left femoral head and selective left lower extremity angiogram was then performed. This showed occlusion of the left common femoral artery. No profunda flow was seen. The femoral popliteal bypass graft was occluded. The circumflex collateral provided minimal blood flow distally.   At this point, I passed a wire which easily traversed into the femoral popliteal bypass graft to gain access to the popliteal artery distally. Imaging showed some stenosis in the popliteal artery just below the bypass graft and nearing the thrombus at the distal anastomosis. I parked a wire into the tibials, heparinized the patient. I instilled 8 mg of tPA from the common femoral artery throughout the bypass graft and in the popliteal artery. This was allowed to dwell and a 5 mm diameter x 10 cm in length angioplasty balloon was inflated in the popliteal artery and the distal bypass graft including the anastomosis.   I elected to leave a lysis catheter in hopes that some perfusion into the profunda would also  be seen in addition to getting the femoral popliteal bypass graft opened, and a lytic catheter was parked in the proximal common femoral artery proximally with the distal tip in the popliteal artery. This was a 130 total length, 50 cm working  length, lysis catheter.   On the last image through the sheath and the distal external iliac artery, there was some flow going in the profunda femoris artery although thrombus was still present in the common femoral artery. At this point, both the sheath and the catheter were secured to the leg. I placed a central line for durable venous access and the patient receiving continuous thrombolysis.   The right neck and chest were sterilely prepped and draped and a sterile surgical field was created. The right jugular vein was visualized with ultrasound and found to be patent. It was accessed under direct ultrasound guidance without difficulty with Seldinger needle. J-wire was placed. After skin nick and dilatation, the central line was placed over the wire and the wire was removed. The catheter tip was parked in the right atrium. It withdrew blood and flushed easily with sterile saline, and all three lumens were secured at 20 cm in the neck and a sterile dressing was placed. The patient tolerated the procedure well and was taken to the recovery room in stable condition and he will be brought back tomorrow for imaging for attempt at revascularization of the left lower extremity.   ____________________________ Annice NeedyJason S. Shaelynn Dragos, MD jsd:np D: 02/23/2013 16:19:39 ET T: 02/23/2013 17:21:00 ET JOB#: [id number]  cc: Annice NeedyJason S. Nyana Haren, MD, <Dictator> Annice NeedyJASON S [place] MD ELECTRONICALLY SIGNED 03/16/2013 9:20

## 2014-07-31 NOTE — Consult Note (Signed)
Brief Consult Note: Diagnosis: left leg ischemia, acute on chronic.   Comments: acute on chronic left leg ischemia.  Patient had a similar episode 2 years ago.  He last took Xaralto yesterday at 12 noon.  His foot feels better today.  He has normal motor function in his foot.  Sensation is slightly diminished, but he says that is not new.  Will plan for angiogram tomorrow.  NPO after midnight.  Continue heparin.  Hold oral anticoagulants.  Electronic Signatures: Nada LibmanBrabham, Vance W (MD)  (Signed [phone]-Nov-15 09:26)  Authored: Brief Consult Note   Last Updated: 22-Nov-15 09:26 by Nada LibmanBrabham, Vance W (MD)

## 2014-07-31 NOTE — Op Note (Signed)
PATIENT NAME:  Devin Cooley, Aryan MR#:  [id number] DATE OF BIRTH:  1929/09/15  DATE OF PROCEDURE:  03/01/2014  PREOPERATIVE DIAGNOSIS: Acute on chronic left lower extremity ischemia with occluded femoral to popliteal bypass graft requiring thrombolysis.   POSTOPERATIVE DIAGNOSIS: Acute on chronic left lower extremity ischemia with occluded femoral to popliteal bypass graft requiring thrombolysis.   PROCEDURES: 1.  Ultrasound guidance for vascular access, right jugular vein.  2.  Fluoroscopic guidance for placement of catheter.  3.  Placement of a right jugular triple-lumen catheter.   SURGEON: Annice NeedyJason S. Ellenor Wisniewski, MD   ANESTHESIA: Local with sedation.   BLOOD LOSS: Minimal.   INDICATION FOR PROCEDURE: An 81 year old Hispanic male who was getting procedure to try to salvage his left leg. He has acute on chronic left lower extremity ischemia with occlusion of his femoral popliteal bypass graft. We are doing lysis to try to salvage this. He needs a good durable venous access so continuous tPA infusing as well as a heparin drip and will  need multiple blood draws and intravenous medications.   DESCRIPTION OF PROCEDURE: The patient's right neck was sterilely prepped and draped and a sterile surgical field was created. The jugular vein was visualized with ultrasound and found to be widely patent. It was then accessed with ultrasound guidance without difficulty with a Seldinger needle. A J-wire was then placed. As we were already in the fluoroscopy suite on the fluoroscopy table, I used fluoroscopy to guide the catheter over the wire and parked the tip at the cavoatrial junction. This was about 15-16 cm at the skin where it was secured with 3 silk sutures. All 3 lumens withdrew blood well and flushed easily with heparinized saline and a sterile dressing was placed. The patient tolerated the procedure well.    ____________________________ Annice NeedyJason S. Alie Moudy, MD jsd:LT D: 03/01/2014 16:18:56 ET T: 03/01/2014  16:34:29 ET JOB#: [id number]  cc: Annice NeedyJason S. Chassidy Layson, MD, <Dictator> Annice NeedyJASON S Lyric Rossano MD ELECTRONICALLY SIGNED 03/21/2014 11:41

## 2014-07-31 NOTE — Discharge Summary (Signed)
PATIENT NAME:  Devin Cooley, Devin Cooley MR#:  [id number] DATE OF BIRTH:  [birth year]   DATE OF ADMISSION: 02/28/2014.   DATE OF DISCHARGE: 03/03/2014.   DISCHARGE DIAGNOSES:  Severe peripheral arterial disease.   DISCHARGE MEDICATIONS: 1.  Hydrochlorothiazide/lisinopril 12.5/20 mg 1 tab p.o. daily.  2.  Atorvastatin 80 mg p.o. at bedtime.  3.  Aspirin 81 mg p.o. daily.  4.  Xarelto 20 mg p.o. daily.   CONSULTS: Vascular surgery, per Annice NeedyJason S. Dew, MD.   PROCEDURES: The patient had a vascular procedure performed on November 24, noted to have a bypass graft that was reopened, treated the profunda for rethrombosis and also found to have a 2 vessel runoff present.   PERTINENT LABS:  On day of discharge, sodium 142, potassium 3.8, creatinine 1.27, glucose 104. White blood cell count 6.1, hemoglobin 10.1, and platelets of 128,000.   BRIEF HOSPITAL COURSE:   Severe peripheral arterial disease. The patient initially came in with complaints of acute on chronic left foot pain with poor circulation, was seen by vascular surgery, underwent a procedure to reopen the vessel and he has perfused the lower extremities much better after that procedure.  He was evaluated by Dr. Wyn Quakerew.  He will continue on Xarelto, the aspirin and the statin control. Continue with home blood pressure medications. He is in stable condition to be discharged home and was able to ambulate prior to discharge.   FOLLOWUP:  He is going to  follow up with vascular surgery 1-2 weeks.    ____________________________ Marisue IvanKanhka Johnney Scarlata, MD kl:DT D: 03/03/2014 12:53:00 ET T: 03/03/2014 12:59:22 ET JOB#: [id number]  cc: Marisue IvanKanhka Lulia Schriner, MD, <Dictator> Marisue IvanKANHKA Marzella Miracle MD ELECTRONICALLY SIGNED 03/08/2014 8:59

## 2014-07-31 NOTE — Op Note (Signed)
PATIENT NAME:  Devin Cooley, Devin Cooley MR#:  [id number] DATE OF BIRTH:  1930/03/23  DATE OF PROCEDURE:  03/02/2014  PREOPERATIVE DIAGNOSES:  1.  Peripheral arterial disease with ischemic rest pain, left lower extremity, with occluded femoral to popliteal bypass graft, status post overnight thrombolysis.  2.  Small saccular abdominal aortic aneurysm.   POSTOPERATIVE DIAGNOSES: 1.  Peripheral arterial disease with ischemic rest pain, left lower extremity, with occluded femoral to popliteal bypass graft, status post overnight thrombolysis.  2.  Small saccular abdominal aortic aneurysm.   PROCEDURE PERFORMED:  1.  Left lower extremity angiogram through existing sheath and catheter.  2.  Percutaneous transluminal angioplasty of distal bypass anastomosis with 6 mm diameter higher-pressure angioplasty balloon.  3.  Percutaneous transluminal angioplasty of the profunda femoris artery for rethrombosis with 5 mm diameter angioplasty balloon.  4.  Percutaneous transluminal angioplasty of left external iliac artery with 7 mm diameter angioplasty balloon.  5.  StarClose closure device, right femoral artery.   SURGEON: Annice NeedyJason S. Emmaline Wahba, MD   ANESTHESIA: Local with monitored conscious sedation.   ESTIMATED BLOOD LOSS: Minimal.   CONTRAST USED: 50 mL Visipaque.   INDICATION FOR PROCEDURE: An 81 year old gentleman who was brought in yesterday and started on thrombolysis for an occluded femoral-to popliteal bypass graft. He returns today for a second look angiography for further evaluation and potential treatment. Risks and benefits were discussed. Informed consent was obtained.   DESCRIPTION OF PROCEDURE: The patient was brought to the vascular suite. Existing sheath, catheter, and groins were sterilely prepped and draped, and a sterile surgical field was created. The lytic catheter was removed. Advantage wire was placed into the tibial vessels. Imaging was then performed. This showed that the bypass graft did have  flow. The profunda had rethrombosed, and there was 2-vessel runoff distally with the anterior tibial artery being better. The distal bypass anastomosis still appeared to have a 50 or 60% stenosis, and I elected to treat this area with a 6 mm angioplasty balloon, but use a higher inflation pressure up to 15 atmospheres. Following this, the flow did appear improved, with only about a 30% residual stenosis that was not flow limiting. The wire was then taken out of the bypass graft and used to cannulate the profunda femoris artery, which was cannulated without difficulty. The profunda was treated with a 5 mm diameter angioplasty balloon in the occluded area in the proximal profunda femoris artery, with a good angiographic completion result and restored patency after angioplasty. There was still flow within the bypass graft, as well. In a magnified oblique projection I pulled the sheath back to the aortic bifurcation to evaluate for any inflow stenosis. There did appear to be some narrowing at the origin of the left external iliac artery that I did not appreciate on yesterday's films. This was not high-grade, but did appear to be in the 50-60% range and, when a 7 balloon was inflated across this area, a significant waist was seen, which resolved with angioplasty. Completion angiogram following the angioplasty showed only about a 20% residual stenosis and more brisk flow.   The sheath was then removed. StarClose closure device was deployed in the usual fashion with excellent hemostatic result.   The patient tolerated the procedure well and was taken to the recovery room in stable condition.     ____________________________ Annice NeedyJason S. Aston Lawhorn, MD jsd:MT D: 03/02/2014 08:26:33 ET T: 03/02/2014 10:23:34 ET JOB#: [id number]  cc: Annice NeedyJason S. Aryanne Gilleland, MD, <Dictator> Annice NeedyJASON S Brea Coleson MD ELECTRONICALLY SIGNED 03/21/2014  11:41 

## 2014-07-31 NOTE — Op Note (Signed)
PATIENT NAME:  Devin Cooley, Devin Cooley MR#:  [id number] DATE OF BIRTH:  [birth year]  DATE OF PROCEDURE:  03/01/2014  PREOPERATIVE DIAGNOSES:  1.  Acute on chronic left lower extremity ischemia.  2.  Occlusion of left femoral to popliteal bypass graft.   POSTOPERATIVE DIAGNOSES:  1.  Acute on chronic left lower extremity ischemia.  2.  Occlusion of left femoral to popliteal bypass graft.   PROCEDURES: 1.  Ultrasound guidance for vascular access to right femoral artery.  2.  Catheter placement to left anterior tibial artery and left posterior tibial artery from right femoral approach.  3.  Aortogram and selective left lower extremity angiogram.  4.  Catheter-directed thrombolysis with 8 mg of tPA to the left profunda femoris artery, left common femoral artery, femoral popliteal bypass, popliteal artery, tibioperoneal trunk and posterior tibial arteries.  5.  Mechanical rheolytic thrombectomy to left common femoral artery, femoral to popliteal bypass, popliteal artery, tibioperoneal trunk and posterior tibial arteries.  6.  Percutaneous transluminal angioplasty of left profunda femoris artery with 5 mm diameter drug-coated angioplasty balloon.  7.  Percutaneous transluminal angioplasty of proximal femoral-popliteal bypass anastomosis with 6 mm diameter high-pressure angioplasty balloon.  8.  Percutaneous transluminal angioplasty of distal femoral popliteal bypass anastomosis with 6 mm diameter Lutonix drug-coated angioplasty balloon.  9.  Percutaneous transluminal angioplasty of left posterior tibial artery and tibioperoneal trunk with 2.5 mm diameter angioplasty balloon.  10.  Percutaneous transluminal angioplasty of left anterior tibial artery with 3 mm diameter angioplasty balloon.  11.  Placement of 135 cm total length, 50 cm working length overnight infusion catheter throughout the femoral to popliteal bypass graft and into the distal popliteal artery.   SURGEON: Festus Barren, MD   ANESTHESIA: Local  with moderate conscious sedation.   BLOOD LOSS: 50 mL.   INDICATION FOR PROCEDURE: This is an 81 year old gentleman with acute left lower extremity ischemia, despite being on anticoagulation. He has a previous femoral popliteal bypass graft. This was salvaged about 1 year ago. He returns with similar symptoms and we are trying to salvage his leg. It is likely he will lose his leg if we are unable to open this and he is willing to take the risk to try to save his leg.   DESCRIPTION OF PROCEDURE: The patient is brought to the vascular suite. Groins were shaved and prepped and a sterile surgical field was created. The right femoral artery is visualized with ultrasound and found to be patent. It was accessed under direct ultrasound guidance without difficulty with a Seldinger needle. A J-wire and 5 French sheath were placed. Pigtail catheter was placed in the aorta at the L1-L2 level. An AP aortogram was performed and pelvic obliques performed. This demonstrates a saccular abdominal aortic aneurysm in the mid abdominal aorta. This is slightly larger than his study 1 year ago, but still appeared to be in the small to medium size range. The iliacs had mild to moderate disease, a little worse on the right than the left. I then crossed the aortic bifurcation and advanced to the left femoral head and selective left lower extremity angiogram was then performed. This showed occlusion of the left common femoral artery, profunda femoris artery, superficial femoral artery and bypass graft. There was very poor flow initially. I placed a 6 [place] Ansell sheath over a Terumo advantage wire and gave the patient 5000 units of intravenous heparin. I then initially crossed the profunda femoris occlusion. Imaging was performed which showed thrombosis at this area. I used  about 2 mg of tPA instilled through the AngioJet Omni catheter in the common femoral artery and profunda femoris artery and then performed balloon angioplasty with  a 5 mm diameter Lutonix drug-coated angioplasty balloon to the profunda femoris artery. This resulted in some restored flow, although there was still some significant thrombus in the common femoral artery causing flow limitation. I then turned my attention to trying to get the bypass graft open. With a Kumpe catheter and advantage wire, I was able to gain access to the occluded bypass graft. I crossed the occluded bypass graft and confirmed intraluminal flow in the popliteal artery. There was very poor runoff distally. The anterior tibial artery had multiple areas of greater than 50% stenosis, but was the best runoff. Its origin had a high-grade stenosis that appeared to be from thrombus in the posterior tibial artery. The posterior tibial artery and tibioperoneal trunk were occluded and this was his best runoff a year ago. I got down into the tibioperoneal trunk and posterior tibial artery and was able to get a wire all the way into the foot. Then 6 mg of tPA was instilled throughout the common femoral artery, femoral to popliteal bypass graft, popliteal artery, tibioperoneal trunk and proximal posterior tibial artery. This was allowed to dwell for approximately 15 minutes. Mechanical rheolytic thrombectomy was performed over the same vessels. This still showed very sluggish flow with what appeared to be thrombus and stenosis at the proximal bypass anastomosis. This was treated with a 6 mm diameter high-pressure angioplasty balloon. There was now flow into the graft but the flow was very sluggish distally. Mechanical rheolytic thrombectomy was performed again. There was stenosis at the distal bypass stenosis that appeared near occlusive as well as thrombus. I treated this with a 5 mm diameter Lutonix drug-coated angioplasty balloon but thrombus still persisted. In addition, his runoff was very poor. To try to best treat this, I took a 2.5 mm diameter angioplasty balloon from the foot up through the tibioperoneal  trunk and the posterior tibial artery in its entirety and tried to treat this lesion. There was still very poor flow. I gained access to the anterior tibial artery and exchanged for an 0.018 wire as well with a 3 mm diameter angioplasty balloon to treat the 3 areas of moderate to high grade stenosis in the anterior tibial artery. This resulted in some improvement in flow, but there was still very sluggish flow. Mechanical rheolytic thrombectomy again was run in the distal bypass anastomosis, popliteal artery, tibioperoneal trunk area but flow remained sluggish. At this point, I felt our only hope of getting his bypass graft open was to run overnight thrombolysis to remove the thrombus. A 135 total length, 50 cm working length infusion catheter was then parked with its distal tip in the below-knee popliteal artery and its proximal tip in the common femoral artery just above the origin of the bypass graft. This was secured with a Prolene suture as was the sheath where heparin will be run through overnight. I then placed a central line which will be dictated separately and the patient was taken to the recovery room in stable condition.    ____________________________ Annice NeedyJason S. Saurav Crumble, MD jsd:TT D: 03/01/2014 16:27:57 ET T: 03/01/2014 16:45:13 ET JOB#: [id number]  cc: Annice NeedyJason S. Naol Ontiveros, MD, <Dictator> Annice NeedyJASON S Glorine Hanratty MD ELECTRONICALLY SIGNED 03/21/2014 11:41

## 2014-07-31 NOTE — Consult Note (Signed)
PATIENT NAME:  Devin Cooley, Devin Cooley MR#:  [id number] DATE OF BIRTH:  February 29, 1930  DATE OF CONSULTATION:  02/28/2014  CONSULTING PHYSICIAN:  Nada LibmanVance W. Selena Swaminathan, MD  PRIMARY CARE PHYSICIAN: Marisue IvanKanhka Linthavong, [facility] at [facility].   ADMISSION DIAGNOSIS: Lower extremity ischemia.   HISTORY: This is an 81 year old, Hispanic gentleman who presented to the emergency department with complaints of left lower extremity pain which began at around 10:00. He stated that his left foot became cold and extremely painful 4 hours prior to admission. By the time he was evaluated in the emergency department, his foot was feeling better and was not as cold. It was reportedly blue at the initial episode, but has become more pink. The patient has a history of a left femoral popliteal bypass graft at an outside institution. He presented with similar complaints, however, by his description, much more severe in November of 2014. At that time he underwent catheter directed thrombolysis to the left common femoral, common femoral popliteal bypass graft. He also had angioplasty of the distal anastomosis and popliteal artery. The patient has been doing well since that time, although he does report numbness in that leg. He was evaluated within the several months at [facility] and was doing well.   Subjectively, the patient states that he is much better this morning. He is on Xarelto, which was discontinued. His last dose was Saturday around noon. He is on a heparin drip now. The patient suffers from hypercholesterolemia, which is managed with a statin. He has been on Xarelto for anticoagulation. He is also on dual medications including an ACE inhibitor for hypertension. The patient is a nonsmoker.   PAST MEDICAL HISTORY: Peripheral vascular disease, hypertension, hyperlipidemia.   PAST SURGICAL HISTORY: Thrombectomy and catheter-directed thrombolysis of the left leg in 2014. He also has had a femoral popliteal bypass graft  on the left. He is status post left carotid endarterectomy.   SOCIAL HISTORY: He lives with his son. Does not smoke or drink.   FAMILY HISTORY: Is negative for any significant cardiovascular disease.   MEDICATIONS: Aspirin 81 mg, atorvastatin 80, hydrochlorothiazide/lisinopril 12.5/20, Xarelto 20 mg.   ALLERGIES: None.   REVIEW OF SYSTEMS: Please see HPI, otherwise all systems are negative.   PHYSICAL EXAMINATION: VITAL SIGNS: Temperature is 98.2, pulse 81, respirations 20, blood pressure 196/98. Pulse oximetry 95% on room air.   GENERAL: He is well appearing, in no acute distress.   HEENT: Normocephalic, atraumatic.   CARDIOVASCULAR: Regular rate and rhythm. He has a palpable right femoral pulse. I cannot palpate a left femoral pulse, although I was able to get a Doppler signal. He does not have any Doppler signals in his left foot.   NEUROLOGIC: The patient has mildly decreased sensation in the left foot, but he has normal motor function.   EXTREMITIES: His bilateral feet are warm and well perfused.   ABDOMEN: Soft and nontender.   PULMONARY: Respirations are nonlabored.   PERTINENT LABORATORY VALUES: Creatinine is 1.68, white blood cell count is 5.5, hemoglobin is 11.9.   ASSESSMENT: Acute on chronic left lower extremity ischemia.   PLAN: The patient has had some form of decreased blood flow to his left leg, which caused him to have acute pain. Subsequently, he has improved. He does not have an ischemic leg at this time. The patient has been on Xarelto. His last dose was yesterday at noon. He should continue on IV heparin with plans for angiography to determine the most appropriate intervention. This is  a limb threatening situation. Most likely, the patient will proceed with angiography tomorrow or Tuesday depending on the timing of his last dose of Xarelto. This was discussed extensively with the patient and he wishes to proceed.     ____________________________ Nada Libman, MD vwb:TT D: 02/28/2014 15:59:04 ET T: 02/28/2014 16:19:24 ET JOB#: [id number]  cc: Nada Libman, MD, <Dictator> Marisue Ivan, MD Nada Libman MD ELECTRONICALLY SIGNED 02/28/2014 19:48

## 2014-07-31 NOTE — H&P (Signed)
PATIENT NAME:  Devin Cooley, [name] MR#:  [id number] DATE OF BIRTH:  1930/03/02  DATE OF ADMISSION:  02/28/2014  REFERRING PHYSICIAN: Cecille AmsterdamJonathan E. Mayford KnifeWilliams, M.D.   PRIMARY CARE DOCTOR: Marisue IvanKanhka Linthavong, M.D., [facility].   ADMISSION DIAGNOSIS: Lower extremity ischemia.   HISTORY OF PRESENT ILLNESS: This is an 81 year old, Hispanic male, who presents to the Emergency Department complaining of left lower extremity pain and stiffness. The patient states that his left foot became cold and extremely painful approximately 4 hours prior to admission. By the time of my examination in the Emergency Department, his foot was feeling much better and was no longer as cold. The color returned to his foot that the daughter-in-law reports was actually blue at one time. The patient has a past medical history significant for arterial procedures to that leg for the same complaints approximately 1 year ago to date. Due to symptoms of arterial occlusion, the Emergency Department called for admission.   REVIEW OF SYSTEMS:  CONSTITUTIONAL: The patient denies fever or weakness.  EYES: Denies inflammation or blurred vision.  ENT: Denies tinnitus or sore throat.  RESPIRATORY: Denies cough or shortness of breath.  CARDIOVASCULAR: Denies chest pain, palpitations.  GASTROINTESTINAL: Denies nausea, vomiting, diarrhea or abdominal pain.  GENITOURINARY: Denies increased frequency or hesitancy of urination. Also denies dysuria.  ENDOCRINE: Denies polyuria, polydipsia.  HEMATOLOGIC AND LYMPHATIC: Denies bleeding, but admits to easy bruising.  INTEGUMENTARY: Denies rashes or lesions.  MUSCULOSKELETAL: Denies arthralgias, but admits to claudication.  NEUROLOGIC: Denies numbness in his extremities at baseline, but admits that immediately prior to the onset of pain in the left lower extremity today, his feet were feeling numb. He denies dysarthria.   PSYCHIATRIC: Denies depression or suicidal ideation.   PAST MEDICAL HISTORY:  Peripheral artery disease, hypertension, hyperlipidemia.   PAST SURGICAL HISTORY: Thrombectomy, as well as thrombolysis of TPA through the common femoral artery. Also multiple bypass procedures of the arterial circulation in the left leg. The patient has had a left carotid endarterectomy, as well.   SOCIAL HISTORY: He lives with his son. He does not smoke, drink or do any drugs.   FAMILY HISTORY: The patient is unaware of any illnesses that are common in his family.   MEDICATIONS:  1. Aspirin 81 mg delayed release 1 tablet p.o. daily.  2. Atorvastatin 80 mg 1 tablet p.o. at bedtime.  3. Hydrochlorothiazide with lisinopril 12.5 mg/20 mg 1 tablet p.o. daily.  4. Xarelto 20 mg 1 tablet p.o. at bedtime.   ALLERGIES: No known drug allergies.   PERTINENT LABORATORY RESULTS AND RADIOGRAPHIC FINDINGS: Serum glucose is 115. B-type natruretic peptide is 373, BUN 39, creatinine 1.68, sodium 139, potassium 4, chloride 105, bicarbonate 23, calcium 8.5, serum albumin is 3.7, alkaline phosphatase 145, AST 38, ALT 35. Troponin is negative. White blood cell count 5.5, hemoglobin 11.9, hematocrit 36.1. INR is 2.6.   Chest x-ray shows chronic obstructive pulmonary disease without evidence of acute superimposed process.   PHYSICAL EXAMINATION:  VITAL SIGNS: Temperature is 98.2, pulse 81, respirations 20, blood pressure 196/98, which gradually reduced to 180/85, once I gave the patient's morning medicines early. Pulse oximetry is 95% on room air.  GENERAL: The patient is alert and oriented x 3, in no apparent distress.  HEENT: Normocephalic, atraumatic. Pupils equal, round, and reactive to light and accommodation. Extraocular movements are intact. Mucous membranes are moist.  NECK: Trachea is midline. No adenopathy.  CHEST: Symmetric, atraumatic.  CARDIOVASCULAR: Regular rate and rhythm. Normal S1, S2. No rubs, clicks, or  murmurs appreciated.  LUNGS: Clear to auscultation bilaterally. Normal effort and  excursion.  ABDOMEN: Positive bowel sounds. Soft, nontender, nondistended. No hepatosplenomegaly.  GENITOURINARY: Deferred.  MUSCULOSKELETAL: The patient moves all 4 extremities equally. There is 5/5 strength in upper and lower extremities bilaterally.  SKIN: warm to touch except for distal left foot which is slightly cool; there are no rashes or lesions. EXTREMITIES: No clubbing, cyanosis, or edema. There is some faint mottling to the left distal foot. He has a thready dorsalis pedis and a faint posterior tibial pulse on the left. He has good distal pulses in his other extremities. NEUROLOGIC: Cranial nerves II through XII grossly intact.  PSYCHIATRIC: Mood is normal. Affect is congruent.   ASSESSMENT AND PLAN: This is an 81 year old male with lower extremity ischemia with a history of peripheral artery disease.   1. Peripheral artery disease. The left lower extremity is cold and painful, although both symptoms are improving at this time. Vascular surgery has been consulted and are aware of the patient's symptoms. Currently his INR is 2.6 and we must wait for this to trend down prior to any revascularization procedures. The patient has been placed on heparin per the recommendation of vascular surgeon, Dr. Ofilia Neas. We will follow his INR and await recommendations from vascular surgery.  2. Hypertension. Continue the patient's home regimen.  3. Hyperlipidemia. Continue statin therapy.  4. Overweight. The patient's body mass index is 26.5. I have encouraged a balanced diet and portion control.  5. Deep vein thrombosis prophylaxis. The patient is currently on treatment dose of heparin.  6. Gastrointestinal prophylaxis is unnecessary as the patient is not critically ill.   CODE STATUS: The patient is a Full Code.   TIME SPENT ON ADMISSION ORDERS AND PATIENT CARE: Approximately 35 minutes.    ____________________________ Kelton Pillar. Sheryle Hail, MD msd:JT D: 02/28/2014 07:31:41 ET T: 02/28/2014  09:18:38 ET JOB#: [id number]  cc: Kelton Pillar. Sheryle Hail, MD, <Dictator> Kelton Pillar Roxy Mastandrea MD ELECTRONICALLY SIGNED 03/12/2014 1:42

## 2014-08-03 ENCOUNTER — Ambulatory Visit
Admit: 2014-08-03 | Disposition: A | Discharge status: Home / Self Care | Payer: Self-pay | Attending: Physician Assistant | Admitting: Physician Assistant

## 2014-08-17 ENCOUNTER — Other Ambulatory Visit: Payer: Self-pay

## 2014-08-17 NOTE — Patient Outreach (Signed)
[facility]N) Care Management  08/17/2014  Gerlene FeeJuan Mickelsen 05/17/1929 [id number]   RN CM attempted outreach call to this patient to discuss the services of [facility].  Unable to reach patient and HIPPA compliant voice mail message left at [phone] Home number and [phone] mobile number.  RN CM will try to reach patient again at a later date.  Wynonia Lawmanheryl Jeter Tomey, RN, Lowella DellMHA, [facility] Telephonic Care Coordinator [phone]

## 2014-08-18 ENCOUNTER — Other Ambulatory Visit: Payer: Self-pay

## 2014-08-18 NOTE — Patient Outreach (Signed)
[facility]N) Care Management  08/18/2014  Gerlene FeeJuan Oldfield [birth year] [id number]   RN CM has made 2nd attempt to reach patient to discuss the services of [facility].  Unable to reach patient and HIPPA compliant voice mail message left with return call back number.  RN CM will try again at a later date to reach patient.  Wynonia Lawmanheryl Hulbert Branscome, RN, Lowella DellMHA, [facility] Telephonic Care Coordinator [phone]

## 2014-08-19 ENCOUNTER — Other Ambulatory Visit: Payer: Self-pay

## 2014-08-19 DIAGNOSIS — I10 Essential (primary) hypertension: Secondary | ICD-10-CM

## 2014-08-19 NOTE — Patient Outreach (Signed)
[facility](THN) Care Management  08/19/2014  Gerlene FeeJuan Dawe 19-Jun-1929 [id number]  RN CM spoke with patient and discussed the services of [facility].  Patient agrees to the services of [facility] and would like to schedule an appointment.  Patient agrees to having a [facility] Health Coach call him monthly and help him learn to self-manage his hypertension.  Patient states he has had high blood pressure greater than 30 years.  According to patient he takes him medication as ordered, however after speaking with his daughter Zella BallRobin she states the opposite.  She states patient will take his medication for awhile and if he starts to feel better he stops taking the medication.  She gave an example of taking his pain medication for his foot.  States he will take a dose and if the pain subsides momentarily he will not take more when the pain comes back. Patient states the medication is not working.  RN CM talked with patient how his dietary choices play an important role in controlling his blood  pressure.  Patient unsure of what food choices he should make.  Patient prefers to be contacted by his cell phone number [phone]. Patient's home number is [phone].  Patient's daughter Leanord AsalRobin Nancarrow cell number is [phone].  RN CM will make a referral for [facility] Health Coach to work short-term with patient on self-managing his chronic condition.  Wynonia Lawmanheryl Annette Bertelson, RN, Lowella DellMHA, [facility] Telephonic Care Coordinator [phone]

## 2014-08-19 NOTE — Patient Outreach (Signed)
[facility](THN) Care Management  08/19/2014  Devin Cooley 02-28-1930 [id number]   Order received from Wynonia Lawmanheryl Poteat, RN requesting Health Coach assignment, [name], RN assigned.  Corrie MckusickLisa O. [facility] Care Management [facility] CM Assistant Phone: [phone] Fax: [phone]

## 2014-08-20 ENCOUNTER — Other Ambulatory Visit: Payer: Self-pay

## 2014-08-20 NOTE — Patient Outreach (Signed)
[facility](THN) Care Management  08/20/2014  Devin FeeJuan Cooley 1930-01-09 [id number]    Telephone call to start initial assessment. Patient reports he is doing ok.  Reports some foot pain.  Discussed with patient pain management.  He verbalized understanding.  He reports that his pain has been diagnosed as nerve pain and he does take pain medication.  Patient reports he does have some hypertension.  Discussed importance of taking medication as directed. He verbalized understanding.  Discussed adding increased amounts of fruits and vegetables to diet and limiting salt intake.  Patient lives with daughter and son.  Daughter takes to appointments and shopping.  Patient gives consent to speak with daughter Devin BallRobin if needed.  Patient unable to finish assessment will call at a later date.   Assessment: Patient would benefit from health coach education on hypertension and self management.     Plan: RN Health Coach will contact patient next week to continue assessment and patient agrees to next outreach call.     Devin Cooley J Devin Elizardo, RN, MSN [facility] Care Management RN Telephonic Health Coach [phone]

## 2014-08-25 ENCOUNTER — Other Ambulatory Visit: Payer: Self-pay

## 2014-08-25 DIAGNOSIS — Z9889 Other specified postprocedural states: Secondary | ICD-10-CM

## 2014-08-25 DIAGNOSIS — I10 Essential (primary) hypertension: Secondary | ICD-10-CM | POA: Insufficient documentation

## 2014-08-25 DIAGNOSIS — I739 Peripheral vascular disease, unspecified: Secondary | ICD-10-CM

## 2014-08-25 DIAGNOSIS — E785 Hyperlipidemia, unspecified: Secondary | ICD-10-CM | POA: Insufficient documentation

## 2014-08-25 NOTE — Patient Outreach (Signed)
[facility](THN) Care Management  [facility] Care [facility]  08/25/2014   Devin FeeJuan Cooley November 02, 1929 [id number]  Subjective: Telephone call to patient to complete initial assessment.  Patient reports his doing so-so.  He reports some feeling of needles to left foot which is all the time.  Patient states he has some circulation problems and that he has had some blood clots as well. He denies that it is pain.  Patient unaware of last blood pressure. Urged patient to talk with his primary doctor about blood pressure goals.  He verbalized understanding.     Current Medications:  Current Outpatient Prescriptions  Medication Sig Dispense Refill  . atorvastatin (LIPITOR) 80 MG tablet Take 80 mg by mouth daily.    Marland Kitchen. lisinopril-hydrochlorothiazide (PRINZIDE,ZESTORETIC) 20-12.5 MG per tablet Take 1 tablet by mouth daily.    . pantoprazole (PROTONIX) 40 MG tablet Take 40 mg by mouth 2 (two) times daily.     No current facility-administered medications for this visit.    Functional Status:  In your present state of health, do you have any difficulty performing the following activities: 08/20/2014  Hearing? N  Vision? N  Difficulty concentrating or making decisions? N  Walking or climbing stairs? N  Dressing or bathing? N  Doing errands, shopping? Y  Preparing Food and eating ? N  Using the Toilet? N  In the past six months, have you accidently leaked urine? N  Do you have problems with loss of bowel control? N  Managing your Medications? N  Managing your Finances? N  Housekeeping or managing your Housekeeping? N    Fall/Depression Screening: PHQ 2/9 Scores 08/20/2014  PHQ - 2 Score 0    THN CM Care Plan Problem One        Patient Outreach Telephone from 08/25/2014 in [facility]   Care Plan Problem One  Knowledge Deficit related to lack of information about hypertension and self care.   Care Plan for Problem One  Active   [facility] Long Term Goal (31-90 days)  Patient blood  pressure will be less than 140/90 within 90 days.     [facility] Long Term Goal Start Date  08/20/14   Interventions for Problem One Long Term Goal  Patient to discuss blood pressure and blood pressure goals with primary care doctor on next visit.     [facility] CM Short Term Goal #1 (0-30 days)  Patient will be able to express knowledge of at least three low salt dietary items within 30 days.   [facility] CM Short Term Goal #1 Start Date  08/20/14   Interventions for Short Term Goal #1  Discussed increasing diet with items such as fruits and vegetables. Discussed with patient foods that are high in salt.    [facility] CM Short Term Goal #2 (0-30 days)  Patient will verbalize taking blood pressure medications as prescribed within 30 days   [facility] CM Short Term Goal #2 Start Date  08/20/14   Interventions for Short Term Goal #2  Educated on importance of taking medication as prescribed.  Reviewed medications and purpose of medications.          Assessment: Patient needs education on hypertension and heart healthy diet.  Patient receptive to health coach and education.     Plan: RN Health Coach will continue to provide outreach phone calls to patient to help with working on goals for self- management of hypertension.  RN Health Coach will send welcome letter and consent packet to patient. RN Health  Coach will send Initial Barriers Letter to MD as well as assessment and care plan.  RN Health Coach will outreach to patient within one month and patient agrees to next outreach.    Bary Lericheionne J Camilia Caywood, RN, MSN [facility] Care Management RN Telephonic Health Coach [phone]

## 2014-08-25 NOTE — Patient Instructions (Signed)

## 2014-08-27 NOTE — Patient Outreach (Signed)
[facility](THN) Care Management  08/27/2014  Gerlene FeeJuan Leaman Dec 28, 1929 [id number]   Initial Barriers letter, assessment and care plan sent to primary physician Dr. Burnadette PopLinthavong via fax.    Bary Lericheionne J Lemar Bakos, RN, MSN [facility] Care Management RN Telephonic Health Coach [phone]

## 2014-09-23 ENCOUNTER — Other Ambulatory Visit: Payer: Self-pay

## 2014-09-23 NOTE — Patient Outreach (Signed)
[facility]) Care Management  09/23/2014  Devin Cooley [birth year] [id number]   Telephone call to patient's daughter Zella Ball to discuss [facility] Care Management services and the return of patients consent.  No answer.  HIPAA compliant voice message left.    [name], RN, MSN [facility] Care Management RN Telephonic Health Coach [phone]

## 2014-09-23 NOTE — Patient Outreach (Signed)
[facility]) Care Management  [facility] Care Manager  09/23/2014   Devin Cooley 04-06-1930 [id number]  Telephone call to patient he states he is doing about the same.  Asked patient if he received the information sent to him.  He states he did receive it but has not sent it back.  Patient mentioned his daughter and states that he did not want to make any bills.  Explained to patient that services were free to him.  He mentioned that his daughter helps him with things.  Asked patient if it was ok if I called his daughter Zella Ball to speak with her.  He says it is ok to contact her.  Will contact daughter.  Patient not sure what his blood pressure is but he is taking his medication.  Encouraged patient to check his blood pressure if he goes to the drug store or [employer]. He verbalized understanding.  Patient reports watching his salt intake.  Discussed foods high in sodium.  No concerns.      Current Medications:  Current Outpatient Prescriptions  Medication Sig Dispense Refill  . atorvastatin (LIPITOR) 80 MG tablet Take 80 mg by mouth daily.    Marland Kitchen lisinopril-hydrochlorothiazide (PRINZIDE,ZESTORETIC) 20-12.5 MG per tablet Take 1 tablet by mouth daily.    . pantoprazole (PROTONIX) 40 MG tablet Take 40 mg by mouth 2 (two) times daily.     No current facility-administered medications for this visit.    Functional Status:  In your present state of health, do you have any difficulty performing the following activities: 08/20/2014  Hearing? N  Vision? N  Difficulty concentrating or making decisions? N  Walking or climbing stairs? N  Dressing or bathing? N  Doing errands, shopping? Y  Preparing Food and eating ? N  Using the Toilet? N  In the past six months, have you accidently leaked urine? N  Do you have problems with loss of bowel control? N  Managing your Medications? N  Managing your Finances? N  Housekeeping or managing your Housekeeping? N    Fall/Depression  Screening: PHQ 2/9 Scores 09/23/2014 08/20/2014  PHQ - 2 Score 0 0    Assessment:  Patient will continue to benefit from health coach interaction for self management of hypertension.  Plan:  RN Health Coach will contact daughter for follow up on consent and [facility] services.  RN Health Coach will contact patient within the next month and patient agrees to next outreach call.    [name], RN, MSN [facility] Care Management RN Telephonic Health Coach [phone]

## 2014-09-23 NOTE — Patient Outreach (Signed)
[facility]) Care Management  09/23/2014  Devin Cooley 1929/07/09 [id number]   Incoming phone call from daughter Devin Cooley.  Spoke with her about patient and [facility] Care Management Services.  She states he did give her a packet to complete but she had not as she has so much going on with family.  She stated that she would help patient get the information back to [facility] for continued services.  No concerns voiced.    Devin Leriche, RN, MSN [facility] Care Management RN Telephonic Health Coach [phone]

## 2014-10-20 ENCOUNTER — Other Ambulatory Visit: Payer: Self-pay

## 2014-10-20 ENCOUNTER — Ambulatory Visit: Payer: Commercial Managed Care - HMO

## 2014-10-20 NOTE — Patient Outreach (Signed)
[facility]N) Care Management  10/20/2014  Devin Cooley April 11, 1929 [id number]   Telephone call to patient for disease management follow up.  Unable to reach.  HIPPA compliant voice message left.  Plan: RN Health Coach will attempt telephone outreach within 1 week.    [name], RN, MSN [facility] Care Management RN Telephonic Health Coach [phone]

## 2014-10-26 ENCOUNTER — Other Ambulatory Visit: Payer: Self-pay

## 2014-10-26 NOTE — Patient Outreach (Signed)
[facility]N) Care Management  10/26/2014  Gerlene FeeJuan Souffrant 1930-02-18 [id number]   Telephone call to Dr. Sherryll BurgerLinthavong's office to inquire about patient Lipitor and Protonix to verify if he is supposed to be still taking those.  Message left for nurse.   Bary Lericheionne J Ayahna Solazzo, RN, MSN [facility] Care [facility] RN Telephonic Health Coach [phone]

## 2014-10-26 NOTE — Patient Outreach (Addendum)
[facility]) Care Management  [place]  10/26/2014   Devin Cooley 1929/09/30 [id number]  Telephone call to patient for monthly call.  Patient reports that he is doing well. No changes in condition.  Patient is checking his blood pressure at home last report 117/65.  Patient reports he is not taking his Lipitor or Protonix because he did not have any refills.  Asked patient if he had an appointment coming up he stated no.  Explained to patient he may need to be taking them despite them not having any refills.  Advised him that health coach would call the PCP to inquire. Coached on low sodium low fat diet.  He verbalized understanding.    Consent: Patient has not sent back consent.  Advised patient that health coach would send another consent to be completed and returned.  He verbalized understanding.     Current Medications:  Current Outpatient Prescriptions  Medication Sig Dispense Refill  . lisinopril-hydrochlorothiazide (PRINZIDE,ZESTORETIC) 20-12.5 MG per tablet Take 1 tablet by mouth daily.    Marland Kitchen atorvastatin (LIPITOR) 80 MG tablet Take 80 mg by mouth daily.    . pantoprazole (PROTONIX) 40 MG tablet Take 40 mg by mouth 2 (two) times daily.     No current facility-administered medications for this visit.    Functional Status:  In your present state of health, do you have any difficulty performing the following activities: 08/20/2014  Hearing? N  Vision? N  Difficulty concentrating or making decisions? N  Walking or climbing stairs? N  Dressing or bathing? N  Doing errands, shopping? Y  Preparing Food and eating ? N  Using the Toilet? N  In the past six months, have you accidently leaked urine? N  Do you have problems with loss of bowel control? N  Managing your Medications? N  Managing your Finances? N  Housekeeping or managing your Housekeeping? N    Fall/Depression Screening: PHQ 2/9 Scores 10/26/2014 09/23/2014 08/20/2014  PHQ - 2 Score 0 0 0    THN CM Care Plan Problem One        Patient Outreach Telephone from 10/26/2014 in [place] Problem One  Knowledge Deficit related to lack of information about hypertension and self care.   Care Plan for Problem One  Active   THN Long Term Goal (31-90 days)  Patient blood pressure will be less than 140/90 within 90 days.     THN Long Term Goal Start Date  10/26/14 [goal restarted]   Interventions for Problem One Long Term Goal  Patient to discuss blood pressure and blood pressure goals with primary care doctor on next visit.  Discussed importance of taking medications.    THN CM Short Term Goal #1 (0-30 days)  Patient will be able to express knowledge of at least three low salt dietary items within 30 days.   [facility] CM Short Term Goal #1 Start Date  10/26/14 [Goal restarted]   Interventions for Short Term Goal #1  Reviewed increasing diet with items such as fruits and vegetables. Discussed with patient foods that are high in salt.  Discussed low fat foods with patient.     THN CM Short Term Goal #2 (0-30 days)  Patient will verbalize taking blood pressure medications as prescribed within 30 days   THN CM Short Term Goal #2 Start Date  08/20/14   [facility] CM Short Term Goal #2 Met Date  09/23/14   Interventions for Short Term Goal #2  Educated on importance of taking medication as prescribed.  Reviewed medications and purpose of medications.       Assessment: Patient continues to benefit from health coach outreach for disease management.  Plan:  RN Health Coach will contact patient within the next month and patient agrees to next outreach.  RN Health Coach will reach out to PCP office to inquire about Protonix and Lipitor.    Jone Baseman, RN, MSN [place] [phone]

## 2014-11-23 ENCOUNTER — Other Ambulatory Visit: Payer: Self-pay

## 2014-11-23 DIAGNOSIS — I739 Peripheral vascular disease, unspecified: Secondary | ICD-10-CM

## 2014-11-23 DIAGNOSIS — E785 Hyperlipidemia, unspecified: Secondary | ICD-10-CM

## 2014-11-23 DIAGNOSIS — I10 Essential (primary) hypertension: Secondary | ICD-10-CM

## 2014-11-23 NOTE — Patient Outreach (Signed)
[facility]) Care Management  11/23/2014  Devin Cooley 17-Feb-1930 [id number]   Telephone call to patient for monthly outreach.  Patient states he is at the doctor's office.  Advised health coach would reschedule call.    Plan: RN Health Coach will contact patient within 1 week.  [name], RN, MSN [facility] Care Management RN Telephonic Health Coach [phone]

## 2014-11-29 ENCOUNTER — Ambulatory Visit: Payer: Commercial Managed Care - HMO

## 2014-11-29 ENCOUNTER — Other Ambulatory Visit: Payer: Self-pay

## 2014-11-29 NOTE — Patient Outreach (Signed)
[facility]) Care Management  11/29/2014  Devin Cooley 03-14-1930 [id number]   Telephone call to patient for 2nd attempt for monthly call.  States no voicemail set up.    Plan: Will contact patient within 1-2 weeks.    [name], RN, MSN [facility] Care Management RN Telephonic Health Coach [phone]

## 2014-12-06 ENCOUNTER — Other Ambulatory Visit: Payer: Self-pay

## 2014-12-06 NOTE — Patient Outreach (Addendum)
[facility]) Care Management  [place]  12/06/2014   Devin Cooley 01-28-1930 [id number]   Telephone call to patient for monthly call.  Patient reports he is doing good.  Reports he saw PCP on 11-23-14.  He states that doctor does not need to see him again for 1 year.  However, patient reports when he went to the MD his blood pressure was elevated some but when he checks at home it is good.  Talked with patient how sometimes just going to the doctor makes people nervous unknowingly and it causes the blood pressure to raise.  He verbalized understanding.  Discussed with patient low fat low salt diet and being sure to add fruits and vegetable to his diet.  He verbalized understanding.  Patient voices no concerns.    Consent: Patient states he did get the consent for [facility].  Patient states he will send consent back.    Current Medications:  Current Outpatient Prescriptions  Medication Sig Dispense Refill  . lisinopril-hydrochlorothiazide (PRINZIDE,ZESTORETIC) 20-12.5 MG per tablet Take 1 tablet by mouth daily.    Marland Kitchen atorvastatin (LIPITOR) 80 MG tablet Take 80 mg by mouth daily.    . pantoprazole (PROTONIX) 40 MG tablet Take 40 mg by mouth 2 (two) times daily.     No current facility-administered medications for this visit.    Functional Status:  In your present state of health, do you have any difficulty performing the following activities: 12/06/2014 08/20/2014  Hearing? - N  Vision? - N  Difficulty concentrating or making decisions? - N  Walking or climbing stairs? - N  Dressing or bathing? - N  Doing errands, shopping? - Y  Preparing Food and eating ? N N  Using the Toilet? N N  In the past six months, have you accidently leaked urine? N N  Do you have problems with loss of bowel control? N N  Managing your Medications? N N  Managing your Finances? N N  Housekeeping or managing your Housekeeping? N N    Fall/Depression Screening: PHQ 2/9 Scores 12/06/2014  10/26/2014 09/23/2014 08/20/2014  PHQ - 2 Score 0 0 0 0   THN CM Care Plan Problem One        Most Recent Value   Care Plan Problem One  Knowledge Deficit related to lack of information about hypertension and self care.   Role Documenting the Problem One  [place] for Problem One  Active   THN Long Term Goal (31-90 days)  Patient blood pressure will be less than 140/90 within 90 days.     THN Long Term Goal Start Date  10/26/14 [goal continued]   Interventions for Problem One Long Term Goal  Discussed with patient importance of monitoring blood pressure himself.     THN CM Short Term Goal #1 (0-30 days)  Patient will be able to express knowledge of at least three low salt dietary items within 30 days.   THN CM Short Term Goal #1 Start Date  12/06/14 [goal continued]   Interventions for Short Term Goal #1  Advised patient on low salt food items and how important it is to limit high salt items.   THN CM Short Term Goal #2 (0-30 days)  Patient will verbalize taking blood pressure medications as prescribed within 30 days   [facility]N CM Short Term Goal #2 Start Date  08/20/14   [facility] CM Short Term Goal #2 Met Date  09/23/14   Interventions for  Short Term Goal #2  Educated on importance of taking medication as prescribed.  Reviewed medications and purpose of medications.        Assessment:  Patient continues to benefit from health coach outreach for disease management.  Plan: RN Health Coach will contact patient within one month and patient agrees to next outreach.    Jone Baseman, RN, MSN [place] [phone]

## 2015-01-03 ENCOUNTER — Other Ambulatory Visit: Payer: Self-pay

## 2015-01-03 DIAGNOSIS — I739 Peripheral vascular disease, unspecified: Secondary | ICD-10-CM

## 2015-01-03 DIAGNOSIS — E785 Hyperlipidemia, unspecified: Secondary | ICD-10-CM

## 2015-01-03 DIAGNOSIS — I10 Essential (primary) hypertension: Secondary | ICD-10-CM

## 2015-01-03 NOTE — Patient Outreach (Signed)
[facility]) Care Management  [place]  01/03/2015   Brahm Barbeau 1930/02/20 [id number]  Subjective:  Telephone call to patient for monthly call.  Patient states he is doing good.  He is checking his blood pressure.  However, patient does not know his last reading but blood pressure has been below 140/90. Patient reports that he is watching his salt intake and able to name items high in salt.  Patient has met goals for program at this time.  Discussed with patient case closure.  Patient is in agreement.  Objective:   Current Medications:  Current Outpatient Prescriptions  Medication Sig Dispense Refill  . atorvastatin (LIPITOR) 80 MG tablet Take 80 mg by mouth daily.    Marland Kitchen lisinopril-hydrochlorothiazide (PRINZIDE,ZESTORETIC) 20-12.5 MG per tablet Take 1 tablet by mouth daily.    . pantoprazole (PROTONIX) 40 MG tablet Take 40 mg by mouth 2 (two) times daily.     No current facility-administered medications for this visit.    Functional Status:  In your present state of health, do you have any difficulty performing the following activities: 12/06/2014 08/20/2014  Hearing? - N  Vision? - N  Difficulty concentrating or making decisions? - N  Walking or climbing stairs? - N  Dressing or bathing? - N  Doing errands, shopping? - Y  Preparing Food and eating ? N N  Using the Toilet? N N  In the past six months, have you accidently leaked urine? N N  Do you have problems with loss of bowel control? N N  Managing your Medications? N N  Managing your Finances? N N  Housekeeping or managing your Housekeeping? N N    Fall/Depression Screening: PHQ 2/9 Scores 01/03/2015 12/06/2014 10/26/2014 09/23/2014 08/20/2014  PHQ - 2 Score 0 0 0 0 0    Assessment: Patient has met goals of the program therefore his case will be closed.    Plan:   [facility] CM Care Plan Problem One        Most Recent Value   Care Plan Problem One  Knowledge Deficit related to lack of information  about hypertension and self care.   Role Documenting the Problem One  [place] for Problem One  Active   THN Long Term Goal (31-90 days)  Patient blood pressure will be less than 140/90 within 90 days.     THN Long Term Goal Start Date  10/26/14 [goal continued]   [facility] Long Term Goal Met Date  01/03/15   Interventions for Problem One Long Term Goal  Reviewed importance of checking blood pressure and keeping less than 140/90   THN CM Short Term Goal #1 (0-30 days)  Patient will be able to express knowledge of at least three low salt dietary items within 30 days.   [facility] CM Short Term Goal #1 Start Date  12/06/14 [goal continued]   [facility] CM Short Term Goal #1 Met Date  01/03/15   Interventions for Short Term Goal #1  Patient able to name bacon, can foods, and sandwich meat as high salt items.     [facility] CM Short Term Goal #2 (0-30 days)  Patient will verbalize taking blood pressure medications as prescribed within 30 days   [facility] CM Short Term Goal #2 Start Date  08/20/14   [facility] CM Short Term Goal #2 Met Date  09/23/14   Interventions for Short Term Goal #2  Educated on importance of taking medication as prescribed.  Reviewed medications and  purpose of medications.      RN Health Coach will send patient a letter confirming case closure.   RN Health Coach will send closure letter to physician.   RN Health Coach will send in basket to Lurline Del for case closure.    Jone Baseman, RN, MSN [place] [phone]

## 2015-01-05 NOTE — Patient Outreach (Signed)
[facility] ([facility]) Care Management  01/05/2015  Devin Cooley 05/02/1929 [id number]   Notification from [name], RN, to close case due to goals met with [facility] Care Management.  Thanks, [name], AABA [facility] Care Management [facility] CM Assistant Phone: [phone] Fax: [phone]  

## 2015-04-20 ENCOUNTER — Other Ambulatory Visit: Payer: Self-pay | Admitting: Nephrology

## 2015-04-20 DIAGNOSIS — N281 Cyst of kidney, acquired: Secondary | ICD-10-CM

## 2015-04-26 ENCOUNTER — Ambulatory Visit: Payer: Commercial Managed Care - HMO

## 2015-05-04 ENCOUNTER — Ambulatory Visit
Admission: RE | Admit: 2015-05-04 | Discharge: 2015-05-04 | Disposition: A | Discharge status: Home / Self Care | Payer: Commercial Managed Care - HMO | Source: Ambulatory Visit | Attending: Nephrology | Admitting: Nephrology

## 2015-05-04 DIAGNOSIS — I700 Atherosclerosis of aorta: Secondary | ICD-10-CM | POA: Diagnosis not present

## 2015-05-04 DIAGNOSIS — K7460 Unspecified cirrhosis of liver: Secondary | ICD-10-CM | POA: Insufficient documentation

## 2015-05-04 DIAGNOSIS — N281 Cyst of kidney, acquired: Secondary | ICD-10-CM

## 2015-05-04 DIAGNOSIS — K8020 Calculus of gallbladder without cholecystitis without obstruction: Secondary | ICD-10-CM | POA: Diagnosis not present

## 2015-05-04 DIAGNOSIS — Q6100 Congenital renal cyst, unspecified: Secondary | ICD-10-CM | POA: Diagnosis present

## 2015-05-04 DIAGNOSIS — I714 Abdominal aortic aneurysm, without rupture: Secondary | ICD-10-CM | POA: Insufficient documentation

## 2015-10-20 ENCOUNTER — Emergency Department: Payer: Commercial Managed Care - HMO

## 2015-10-20 ENCOUNTER — Encounter: Payer: Self-pay | Admitting: Emergency Medicine

## 2015-10-20 ENCOUNTER — Emergency Department
Admission: EM | Admit: 2015-10-20 | Discharge: 2015-10-20 | Disposition: A | Discharge status: Home / Self Care | Payer: Commercial Managed Care - HMO | Attending: Emergency Medicine | Admitting: Emergency Medicine

## 2015-10-20 DIAGNOSIS — M79605 Pain in left leg: Secondary | ICD-10-CM | POA: Diagnosis present

## 2015-10-20 DIAGNOSIS — Z87891 Personal history of nicotine dependence: Secondary | ICD-10-CM | POA: Diagnosis not present

## 2015-10-20 DIAGNOSIS — Z79899 Other long term (current) drug therapy: Secondary | ICD-10-CM | POA: Insufficient documentation

## 2015-10-20 DIAGNOSIS — E785 Hyperlipidemia, unspecified: Secondary | ICD-10-CM | POA: Diagnosis not present

## 2015-10-20 DIAGNOSIS — I739 Peripheral vascular disease, unspecified: Secondary | ICD-10-CM | POA: Diagnosis not present

## 2015-10-20 DIAGNOSIS — I10 Essential (primary) hypertension: Secondary | ICD-10-CM | POA: Insufficient documentation

## 2015-10-20 LAB — CBC WITH DIFFERENTIAL/PLATELET
Basophils Absolute: 0 10*3/uL (ref 0–0.1)
Basophils Relative: 1 %
Eosinophils Absolute: 0 10*3/uL (ref 0–0.7)
Eosinophils Relative: 1 %
HCT: 33 % — ABNORMAL LOW (ref 40.0–52.0)
HEMOGLOBIN: 11.3 g/dL — AB (ref 13.0–18.0)
Lymphocytes Relative: 13 %
Lymphs Abs: 0.7 10*3/uL — ABNORMAL LOW (ref 1.0–3.6)
MCH: 30.5 pg (ref 26.0–34.0)
MCHC: 34.2 g/dL (ref 32.0–36.0)
MCV: 89.1 fL (ref 80.0–100.0)
MONOS PCT: 7 %
Monocytes Absolute: 0.4 10*3/uL (ref 0.2–1.0)
NEUTROS ABS: 4.5 10*3/uL (ref 1.4–6.5)
NEUTROS PCT: 78 %
Platelets: 181 10*3/uL (ref 150–440)
RBC: 3.7 MIL/uL — ABNORMAL LOW (ref 4.40–5.90)
RDW: 14.7 % — ABNORMAL HIGH (ref 11.5–14.5)
WBC: 5.7 10*3/uL (ref 3.8–10.6)

## 2015-10-20 LAB — BASIC METABOLIC PANEL
ANION GAP: 8 (ref 5–15)
BUN: 54 mg/dL — ABNORMAL HIGH (ref 6–20)
CO2: 22 mmol/L (ref 22–32)
Calcium: 9.3 mg/dL (ref 8.9–10.3)
Chloride: 107 mmol/L (ref 101–111)
Creatinine, Ser: 1.66 mg/dL — ABNORMAL HIGH (ref 0.61–1.24)
GFR calc non Af Amer: 37 mL/min — ABNORMAL LOW (ref >60)
GFR, EST AFRICAN AMERICAN: 42 mL/min — AB (ref >60)
Glucose, Bld: 157 mg/dL — ABNORMAL HIGH (ref 65–99)
Potassium: 4.6 mmol/L (ref 3.5–5.1)
Sodium: 137 mmol/L (ref 135–145)

## 2015-10-20 LAB — PROTIME-INR
INR: 1.2
Prothrombin Time: 15.4 seconds — ABNORMAL HIGH (ref 11.4–15.0)

## 2015-10-20 LAB — APTT: APTT: 35 s (ref 24–36)

## 2015-10-20 NOTE — ED Notes (Signed)
Patient presents to ED with LEFT leg pain since 3 pm today. Daughter reports pt has hx of blood clots, pt has had 2 vein surgeries in the past. RIGHT foot cold to touch pale couldnt palpated pulse. Pt able to move foot. Pt daughter states was seen [facility] on May 26, 2014 and was told vein in LEFT leg was blocked, she was told if pt began to have pain to have leg checked out at the ED. LEFT foot cool to touch, pale, No pedal pulses found.  MD to do a doppler. Will continue to monitor.

## 2015-10-20 NOTE — Discharge Instructions (Signed)
Peripheral Vascular Disease °Peripheral vascular disease (PVD) is a disease of the blood vessels that are not part of your heart and brain. A simple term for PVD is poor circulation. In most cases, PVD narrows the blood vessels that carry blood from your heart to the rest of your body. This can result in a decreased supply of blood to your arms, legs, and internal organs, like your stomach or kidneys. However, it most often affects a person's lower legs and feet. °There are two types of PVD. °· Organic PVD. This is the more common type. It is caused by damage to the structure of blood vessels. °· Functional PVD. This is caused by conditions that make blood vessels contract and tighten (spasm). °Without treatment, PVD tends to get worse over time. °PVD can also lead to acute ischemic limb. This is when an arm or limb suddenly has trouble getting enough blood. This is a medical emergency. °CAUSES °Each type of PVD has many different causes. The most common cause of PVD is buildup of a fatty material (plaque) inside of your arteries (atherosclerosis). Small amounts of plaque can break off from the walls of the blood vessels and become lodged in a smaller artery. This blocks blood flow and can cause acute ischemic limb. °Other common causes of PVD include: °· Blood clots that form inside of blood vessels. °· Injuries to blood vessels. °· Diseases that cause inflammation of blood vessels or cause blood vessel spasms. °· Health behaviors and health history that increase your risk of developing PVD. °RISK FACTORS  °You may have a greater risk of PVD if you: °· Have a family history of PVD. °· Have certain medical conditions, including: °· High cholesterol. °· Diabetes. °· High blood pressure (hypertension). °· Coronary heart disease. °· Past problems with blood clots. °· Past injury, such as burns or a broken bone. These may have damaged blood vessels in your limbs. °· Buerger disease. This is caused by inflamed blood  vessels in your hands and feet. °· Some forms of arthritis. °· Rare birth defects that affect the arteries in your legs. °· Use tobacco. °· Do not get enough exercise. °· Are obese. °· Are age 50 or older. °SIGNS AND SYMPTOMS  °PVD may cause many different symptoms. Your symptoms depend on what part of your body is not getting enough blood. Some common signs and symptoms include: °· Cramps in your lower legs. This may be a symptom of poor leg circulation (claudication). °· Pain and weakness in your legs while you are physically active that goes away when you rest (intermittent claudication). °· Leg pain when at rest. °· Leg numbness, tingling, or weakness. °· Coldness in a leg or foot, especially when compared with the other leg. °· Skin or hair changes. These can include: °· Hair loss. °· Shiny skin. °· Pale or bluish skin. °· Thick toenails. °· Inability to get or maintain an erection (erectile dysfunction). °People with PVD are more prone to developing ulcers and sores on their toes, feet, or legs. These may take longer than normal to heal. °DIAGNOSIS °Your health care provider may diagnose PVD from your signs and symptoms. The health care provider will also do a physical exam. You may have tests to find out what is causing your PVD and determine its severity. Tests may include: °· Blood pressure recordings from your arms and legs and measurements of the strength of your pulses (pulse volume recordings). °· Imaging studies using sound waves to take pictures of   the blood flow through your blood vessels (Doppler ultrasound). °· Injecting a dye into your blood vessels before having imaging studies using: °¨ X-rays (angiogram or arteriogram). °¨ Computer-generated X-rays (CT angiogram). °¨ A powerful electromagnetic field and a computer (magnetic resonance angiogram or MRA). °TREATMENT °Treatment for PVD depends on the cause of your condition and the severity of your symptoms. It also depends on your age. Underlying  causes need to be treated and controlled. These include long-lasting (chronic) conditions, such as diabetes, high cholesterol, and high blood pressure. You may need to first try making lifestyle changes and taking medicines. Surgery may be needed if these do not work. °Lifestyle changes may include: °· Quitting smoking. °· Exercising regularly. °· Following a low-fat, low-cholesterol diet. °Medicines may include: °· Blood thinners to prevent blood clots. °· Medicines to improve blood flow. °· Medicines to improve your blood cholesterol levels. °Surgical procedures may include: °· A procedure that uses an inflated balloon to open a blocked artery and improve blood flow (angioplasty). °· A procedure to put in a tube (stent) to keep a blocked artery open (stent implant). °· Surgery to reroute blood flow around a blocked artery (peripheral bypass surgery). °· Surgery to remove dead tissue from an infected wound on the affected limb. °· Amputation. This is surgical removal of the affected limb. This may be necessary in cases of acute ischemic limb that are not improved through medical or surgical treatments. °HOME CARE INSTRUCTIONS °· Take medicines only as directed by your health care provider. °· Do not use any tobacco products, including cigarettes, chewing tobacco, or electronic cigarettes.  If you need help quitting, ask your health care provider. °· Lose weight if you are overweight, and maintain a healthy weight as directed by your health care provider. °· Eat a diet that is low in fat and cholesterol. If you need help, ask your health care provider. °· Exercise regularly. Ask your health care provider to suggest some good activities for you. °· Use compression stockings or other mechanical devices as directed by your health care provider. °· Take good care of your feet. °¨ Wear comfortable shoes that fit well. °¨ Check your feet often for any cuts or sores. °SEEK MEDICAL CARE IF: °· You have cramps in your legs  while walking. °· You have leg pain when you are at rest. °· You have coldness in a leg or foot. °· Your skin changes. °· You have erectile dysfunction. °· You have cuts or sores on your feet that are not healing. °SEEK IMMEDIATE MEDICAL CARE IF: °· Your arm or leg turns cold and blue. °· Your arms or legs become red, warm, swollen, painful, or numb. °· You have chest pain or trouble breathing. °· You suddenly have weakness in your face, arm, or leg. °· You become very confused or lose the ability to speak. °· You suddenly have a very bad headache or lose your vision. °  °This information is not intended to replace advice given to you by your health care provider. Make sure you discuss any questions you have with your health care provider. °  °Document Released: 05/03/2004 Document Revised: 04/16/2014 Document Reviewed: 09/03/2013 °Elsevier Interactive Patient Education ©2016 [employer] Inc[employer] ° °Intermittent Claudication °Intermittent claudication is pain in your leg that occurs when you walk or exercise and goes away when you rest. The pain can occur in one or both legs. °CAUSES °Intermittent claudication is caused by the buildup of plaque within the major arteries in the body (atherosclerosis).   The plaque, which makes arteries stiff and narrow, prevents enough blood from reaching your leg muscles. The pain occurs when you walk or exercise because your muscles need more blood when you are moving and exercising. °RISK FACTORS °Risk factors include: °· A family history of atherosclerosis. °· A personal history of stroke or heart disease. °· Older age. °· Being inactive or overweight. °· Smoking cigarettes. °· Having another health condition such as: °¨ Diabetes. °¨ High blood pressure. °¨ High cholesterol. °SIGNS AND SYMPTOMS  °Your hip or leg may:  °· Ache. °· Cramp. °· Feel tight. °· Feel weak. °· Feel heavy. °Over time, you may feel pain in your calf, thigh, or hip. °DIAGNOSIS  °Your health care provider may diagnose  intermittent claudication based on your symptoms and medical history. Your health care provider may also do tests to learn more about your condition. These may include: °· Blood tests. °· An ultrasound. °· Imaging tests such as angiography, magnetic resonance angiography (MRA), and computed tomography angiography (CTA). °TREATMENT °You may be treated for problems such as: °· High blood pressure. °· High cholesterol. °· Diabetes. °Other treatments may include: °· Lifestyle changes such as: °¨ Starting an exercise program. °¨ Losing weight. °¨ Quitting smoking. °· Medicines to help restore blood flow through your legs. °· Blood vessel surgery (angioplasty) to restore blood flow if your intermittent claudication is caused by severe peripheral artery disease. °HOME CARE INSTRUCTIONS °· Manage any other health conditions you have. °· Eat a diet low in saturated fats and calories to maintain a healthy weight. °· Quit smoking, if you smoke. °· Take medicines only as directed by your health care provider. °· If your health care provider recommended an exercise program for you, follow it as directed. Your exercise program may involve: °¨ Walking three or more times a week. °¨ Walking until you have certain symptoms of intermittent claudication. °¨ Resting until symptoms go away. °¨ Gradually increasing walking time to about 50 minutes a day. °SEEK MEDICAL CARE IF: °Your condition is not getting better or is getting worse. °SEEK IMMEDIATE MEDICAL CARE IF:  °· You have chest pain. °· You have difficulty breathing. °· You develop arm weakness. °· You have trouble speaking. °· Your face begins to droop. °MAKE SURE YOU: °· Understand these instructions. °· Will watch your condition. °· Will get help if you are not doing well or get worse. °  °This information is not intended to replace advice given to you by your health care provider. Make sure you discuss any questions you have with your health care provider. °  °Document  Released: 01/27/2004 Document Revised: 04/16/2014 Document Reviewed: 07/02/2013 °Elsevier Interactive Patient Education ©2016 Elsevier Inc. ° °

## 2015-10-20 NOTE — ED Provider Notes (Signed)
[facility] Emergency Department Provider Note  ____________________________________________  Time seen: 9:00 PM  I have reviewed the triage vital signs and the nursing notes.   HISTORY  Chief Complaint Leg Pain    HPI Devin Cooley is a 82 y.o. male comes the complaining of left leg pain that started at 3 PM today. It's been constant since then, worse with walking. States the leg "gave out" on him earlier today when he was standing up. 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 Also has a history of a left femoropopliteal bypass requiring angiography and thrombolysis in the past. No chest pain shortness of breath dizziness or syncope. No fevers or chills or vomiting. He is currently eating a barbecue sandwich.     Past Medical History  Diagnosis Date  . Hypertension   . PAD (peripheral artery disease) (HCC)     unknown date  . H/O: GI bleed     unknown date  . Hyperlipidemia      Patient Active Problem List   Diagnosis Date Noted  . Essential hypertension 08/25/2014  . Hyperlipidemia 08/25/2014  . PAD (peripheral artery disease) (HCC) 08/25/2014  . H/O carotid endarterectomy 08/25/2014     Past Surgical History  Procedure Laterality Date  . Blood clot    . Hernia repair      50 years     Current Outpatient Rx  Name  Route  Sig  Dispense  Refill  . atorvastatin (LIPITOR) 80 MG tablet   Oral   Take 80 mg by mouth daily.         Marland Kitchen lisinopril-hydrochlorothiazide (PRINZIDE,ZESTORETIC) 20-12.5 MG per tablet   Oral   Take 1 tablet by mouth daily.         . pantoprazole (PROTONIX) 40 MG tablet   Oral   Take 40 mg by mouth 2 (two) times daily.            Allergies Review of patient's allergies indicates no known allergies.   No family history on file.  Social History Social History  Substance Use Topics  . Smoking status: Former Games developer  . Smokeless tobacco: None  . Alcohol Use: No    Review of Systems  Constitutional:   No fever  or chills.   Cardiovascular:   No chest pain. Respiratory:   No dyspnea or cough. Gastrointestinal:   Negative for abdominal pain, vomiting and diarrhea.   Musculoskeletal:   Left leg pain Neurological:   Left leg numbness. 10-point ROS otherwise negative.  ____________________________________________   PHYSICAL EXAM:  VITAL SIGNS: ED Triage Vitals  Enc Vitals Group     BP 10/20/15 2003 198/93 mmHg     Pulse Rate 10/20/15 2003 83     Resp 10/20/15 2003 18     Temp 10/20/15 2003 98.7 F (37.1 C)     Temp Source 10/20/15 2003 Oral     SpO2 10/20/15 2003 97 %     Weight 10/20/15 2003 174 lb (78.926 kg)     Height 10/20/15 2003 5\' 10"  (1.778 m)     Head Cir --      Peak Flow --      Pain Score 10/20/15 2005 10     Pain Loc --      Pain Edu? --      Excl. in GC? --     Vital signs reviewed, nursing assessments reviewed.   Constitutional:   Alert and oriented. Well appearing and in no distress. Eyes:   No  scleral icterus. No conjunctival pallor.  ENT   Head:   Normocephalic and atraumatic.      Mouth/Throat:   MMM, no pharyngeal erythema. No peritonsillar mass.    Neck:   No stridor. No SubQ emphysema. No meningismus. Hematological/Lymphatic/Immunilogical:   No cervical lymphadenopathy. Cardiovascular:   RRR. Thready DP pulse on the right, no palpable pulse on the left foot. There is a strong Doppler pulse in the right DP and PT. No Doppler pulse on the left DP or PT. There is a dopplerable popliteal pulse on the left leg. Capillary refill on the right foot toes is about 4 seconds, capillary refill on the left foot is 6-8 seconds. Left foot slightly cold to touch.  Respiratory:   Normal respiratory effort without tachypnea nor retractions. Breath sounds are clear and equal bilaterally. No wheezes/rales/rhonchi. Gastrointestinal:   Soft and nontender. Non distended. There is no CVA tenderness.  No rebound, rigidity, or guarding. Genitourinary:    deferred Musculoskeletal:   Nontender with normal range of motion in all extremities. No joint effusions.  No lower extremity tenderness.  No edema. Neurologic:   Normal speech and language.  CN 2-10 normal. Motor grossly intact. No gross focal neurologic deficits are appreciated.  Skin:    Skin is warm, dry and intact. Except for left foot as above which is slightly cold. No rash noted.  No petechiae, purpura, or bullae.  ____________________________________________    LABS (pertinent positives/negatives) (all labs ordered are listed, but only abnormal results are displayed) Labs Reviewed  BASIC METABOLIC PANEL - Abnormal; Notable for the following:    Glucose, Bld 157 (*)    BUN 54 (*)    Creatinine, Ser 1.66 (*)    GFR calc non Af Amer 37 (*)    GFR calc Af Amer 42 (*)    All other components within normal limits  CBC WITH DIFFERENTIAL/PLATELET - Abnormal; Notable for the following:    RBC 3.70 (*)    Hemoglobin 11.3 (*)    HCT 33.0 (*)    RDW 14.7 (*)    Lymphs Abs 0.7 (*)    All other components within normal limits  PROTIME-INR - Abnormal; Notable for the following:    Prothrombin Time 15.4 (*)    All other components within normal limits  APTT   ____________________________________________   EKG    ____________________________________________    RADIOLOGY  Ultrasound left lower extremity unremarkable, no evidence of DVT.  ____________________________________________   PROCEDURES   ____________________________________________   INITIAL IMPRESSION / ASSESSMENT AND PLAN / ED COURSE  Pertinent labs & imaging results that were available during my care of the patient were reviewed by me and considered in my medical decision making (see chart for details).  Patient well appearing no acute distress. Presents with decreased pulse to the left foot. There is intact blood flow with capillary refill although it is significantly diminished. Labs unremarkable and  consistent with establish CK D. Ultrasound unremarkable and without any evidence of DVT.Case discussed with Dr. due who can see the patient in clinic tomorrow. As the leg is not frankly ischemic at this time he does not appear to warrant hospitalization and anticoagulation. Discussed with patient and daughter at bedside who are in agreement with this plan. Patient will take Tylenol as needed for pain and feels comfortable at the moment.     ____________________________________________   FINAL CLINICAL IMPRESSION(S) / ED DIAGNOSES  Final diagnoses:  Left leg pain  PAD (peripheral artery disease) (HCC)  Portions of this note were generated with dragon dictation software. Dictation errors may occur despite best attempts at proofreading.   Sharman Cheek, MD 10/20/15 [phone]

## 2015-10-20 NOTE — ED Notes (Signed)
MD at bedside. 

## 2015-10-20 NOTE — ED Notes (Addendum)
Patient arrives to triage in wheelchair c/o LEFT leg pain since 3 pm today. Daughter reports pt has hx of blood clots, pt has had 2 vein surgeries in the past. RIGHT foot cold to touch. Pt able to move foot. Pt daughter reports was seen [facility] clinic on May 26, 2014 and was told vein in LEFT leg was blocked, was told if pt began to have pain to have leg checked out. Foot cool to touch, pink in color. No pedal pulses found.

## 2015-10-20 NOTE — ED Notes (Signed)
Discharge instructions reviewed with patient. Questions fielded by this RN. Patient verbalizes understanding of instructions. Patient discharged home in stable condition per Stafford MD . No acute distress noted at time of discharge.  

## 2015-10-24 ENCOUNTER — Other Ambulatory Visit: Payer: Self-pay | Admitting: Vascular Surgery

## 2015-10-26 ENCOUNTER — Observation Stay
Admission: RE | Admit: 2015-10-26 | Discharge: 2015-10-28 | Disposition: A | Discharge status: Skilled Nursing Facility | Payer: Commercial Managed Care - HMO | Source: Ambulatory Visit | Attending: Vascular Surgery | Admitting: Vascular Surgery

## 2015-10-26 ENCOUNTER — Encounter
Admission: RE | Disposition: A | Discharge status: Skilled Nursing Facility | Payer: Self-pay | Source: Ambulatory Visit | Attending: Vascular Surgery

## 2015-10-26 DIAGNOSIS — I10 Essential (primary) hypertension: Secondary | ICD-10-CM | POA: Insufficient documentation

## 2015-10-26 DIAGNOSIS — I998 Other disorder of circulatory system: Secondary | ICD-10-CM | POA: Diagnosis present

## 2015-10-26 DIAGNOSIS — E7800 Pure hypercholesterolemia, unspecified: Secondary | ICD-10-CM | POA: Diagnosis not present

## 2015-10-26 DIAGNOSIS — Z9889 Other specified postprocedural states: Secondary | ICD-10-CM | POA: Insufficient documentation

## 2015-10-26 DIAGNOSIS — Z79899 Other long term (current) drug therapy: Secondary | ICD-10-CM | POA: Insufficient documentation

## 2015-10-26 DIAGNOSIS — I6529 Occlusion and stenosis of unspecified carotid artery: Secondary | ICD-10-CM | POA: Diagnosis not present

## 2015-10-26 DIAGNOSIS — I70219 Atherosclerosis of native arteries of extremities with intermittent claudication, unspecified extremity: Secondary | ICD-10-CM | POA: Diagnosis present

## 2015-10-26 DIAGNOSIS — I70202 Unspecified atherosclerosis of native arteries of extremities, left leg: Principal | ICD-10-CM | POA: Insufficient documentation

## 2015-10-26 DIAGNOSIS — Z7982 Long term (current) use of aspirin: Secondary | ICD-10-CM | POA: Insufficient documentation

## 2015-10-26 HISTORY — PX: PERIPHERAL VASCULAR CATHETERIZATION: SHX172C

## 2015-10-26 SURGERY — LOWER EXTREMITY ANGIOGRAPHY
Anesthesia: Moderate Sedation | Laterality: Left

## 2015-10-26 MED ORDER — MORPHINE SULFATE (PF) 2 MG/ML IV SOLN
INTRAVENOUS | Status: AC
  Filled 2015-10-26: qty 1

## 2015-10-26 MED ORDER — IOPAMIDOL (ISOVUE-300) INJECTION 61%
INTRAVENOUS | Status: DC | PRN
Start: 2015-10-26 — End: 2015-10-26
  Administered 2015-10-26: 80 mL via INTRA_ARTERIAL

## 2015-10-26 MED ORDER — MAGNESIUM HYDROXIDE 400 MG/5ML PO SUSP: 30.0000 mL | Freq: Every day | ORAL | Status: DC | PRN

## 2015-10-26 MED ORDER — SODIUM CHLORIDE 0.9 % IV SOLN
INTRAVENOUS | Status: DC
  Administered 2015-10-26: 10:00:00 via INTRAVENOUS

## 2015-10-26 MED ORDER — SODIUM CHLORIDE 0.9% FLUSH
3.0000 mL | Freq: Two times a day (BID) | INTRAVENOUS | Status: DC
  Administered 2015-10-26 – 2015-10-28 (×6): 3 mL via INTRAVENOUS

## 2015-10-26 MED ORDER — MIDAZOLAM HCL 5 MG/5ML IJ SOLN
INTRAMUSCULAR | Status: AC
Start: 2015-10-26 — End: 2015-10-26
  Filled 2015-10-26: qty 5

## 2015-10-26 MED ORDER — MIDAZOLAM HCL 2 MG/2ML IJ SOLN
INTRAMUSCULAR | Status: DC | PRN
  Administered 2015-10-26: 2 mg via INTRAVENOUS

## 2015-10-26 MED ORDER — HEPARIN SODIUM (PORCINE) 1000 UNIT/ML IJ SOLN
INTRAMUSCULAR | Status: DC | PRN
  Administered 2015-10-26: 5000 [IU] via INTRAVENOUS

## 2015-10-26 MED ORDER — HYDROMORPHONE HCL 1 MG/ML IJ SOLN
INTRAMUSCULAR | Status: AC
  Administered 2015-10-26: 1 mg via INTRAVENOUS
  Filled 2015-10-26: qty 1

## 2015-10-26 MED ORDER — LIDOCAINE-EPINEPHRINE (PF) 1 %-1:200000 IJ SOLN
INTRAMUSCULAR | Status: AC
  Filled 2015-10-26: qty 30

## 2015-10-26 MED ORDER — LABETALOL HCL 5 MG/ML IV SOLN
INTRAVENOUS | Status: AC
  Filled 2015-10-26: qty 4

## 2015-10-26 MED ORDER — FAMOTIDINE 20 MG PO TABS: 40.0000 mg | ORAL_TABLET | ORAL | Status: DC | PRN

## 2015-10-26 MED ORDER — ALTEPLASE 2 MG IJ SOLR
INTRAMUSCULAR | Status: DC | PRN
  Administered 2015-10-26: 4 mg

## 2015-10-26 MED ORDER — FENTANYL CITRATE (PF) 100 MCG/2ML IJ SOLN
INTRAMUSCULAR | Status: AC
  Filled 2015-10-26: qty 2

## 2015-10-26 MED ORDER — SORBITOL 70 % SOLN
30.0000 mL | Freq: Every day | Status: DC | PRN
  Filled 2015-10-26: qty 30

## 2015-10-26 MED ORDER — VITAMIN D 1000 UNITS PO TABS
1000.0000 [IU] | ORAL_TABLET | Freq: Every day | ORAL | Status: DC
  Administered 2015-10-26 – 2015-10-28 (×3): 1000 [IU] via ORAL
  Filled 2015-10-26 (×5): qty 1

## 2015-10-26 MED ORDER — ONDANSETRON HCL 4 MG/2ML IJ SOLN: 4.0000 mg | Freq: Four times a day (QID) | INTRAMUSCULAR | Status: DC | PRN

## 2015-10-26 MED ORDER — FENTANYL CITRATE (PF) 100 MCG/2ML IJ SOLN
INTRAMUSCULAR | Status: DC | PRN
  Administered 2015-10-26: 50 ug via INTRAVENOUS

## 2015-10-26 MED ORDER — DOCUSATE SODIUM 100 MG PO CAPS
100.0000 mg | ORAL_CAPSULE | Freq: Two times a day (BID) | ORAL | Status: DC
  Administered 2015-10-26 – 2015-10-28 (×5): 100 mg via ORAL
  Filled 2015-10-26 (×5): qty 1

## 2015-10-26 MED ORDER — METHYLPREDNISOLONE SODIUM SUCC 125 MG IJ SOLR: 125.0000 mg | INTRAMUSCULAR | Status: DC | PRN

## 2015-10-26 MED ORDER — ACETAMINOPHEN 325 MG PO TABS
650.0000 mg | ORAL_TABLET | Freq: Four times a day (QID) | ORAL | Status: DC | PRN
  Administered 2015-10-27: 650 mg via ORAL
  Filled 2015-10-26: qty 2

## 2015-10-26 MED ORDER — DEXTROSE 5 % IV SOLN
1.5000 g | INTRAVENOUS | Status: AC
  Administered 2015-10-26: 1.5 g via INTRAVENOUS

## 2015-10-26 MED ORDER — HYDROCODONE-ACETAMINOPHEN 5-325 MG PO TABS
1.0000 | ORAL_TABLET | Freq: Every day | ORAL | Status: DC
  Administered 2015-10-27 – 2015-10-28 (×2): 2 via ORAL
  Filled 2015-10-26 (×2): qty 2

## 2015-10-26 MED ORDER — ACETAMINOPHEN 650 MG RE SUPP: 650.0000 mg | Freq: Four times a day (QID) | RECTAL | Status: DC | PRN

## 2015-10-26 MED ORDER — TIROFIBAN HCL IV 12.5 MG/250 ML
INTRAVENOUS | Status: AC
  Administered 2015-10-26: 12500 ug
  Filled 2015-10-26: qty 250

## 2015-10-26 MED ORDER — LISINOPRIL-HYDROCHLOROTHIAZIDE 20-12.5 MG PO TABS: 1.0000 | ORAL_TABLET | Freq: Every day | ORAL | Status: DC

## 2015-10-26 MED ORDER — LISINOPRIL 20 MG PO TABS
20.0000 mg | ORAL_TABLET | Freq: Every day | ORAL | Status: DC
  Administered 2015-10-27 – 2015-10-28 (×2): 20 mg via ORAL
  Filled 2015-10-26 (×2): qty 1

## 2015-10-26 MED ORDER — DEXTROSE-NACL 5-0.9 % IV SOLN
INTRAVENOUS | Status: DC
  Administered 2015-10-26 – 2015-10-27 (×2): via INTRAVENOUS

## 2015-10-26 MED ORDER — TIROFIBAN HCL IN NACL 5-0.9 MG/100ML-% IV SOLN: 0.0750 ug/kg/min | INTRAVENOUS | Status: DC

## 2015-10-26 MED ORDER — ATORVASTATIN CALCIUM 80 MG PO TABS
80.0000 mg | ORAL_TABLET | Freq: Every day | ORAL | Status: DC
  Administered 2015-10-26 – 2015-10-27 (×2): 80 mg via ORAL
  Filled 2015-10-26 (×5): qty 1

## 2015-10-26 MED ORDER — DEXTROSE 5 % IV SOLN
INTRAVENOUS | Status: AC
  Filled 2015-10-26 (×33): qty 1.5

## 2015-10-26 MED ORDER — ASPIRIN EC 81 MG PO TBEC
81.0000 mg | DELAYED_RELEASE_TABLET | Freq: Every day | ORAL | Status: DC
Start: 2015-10-26 — End: 2015-10-28
  Administered 2015-10-27 – 2015-10-28 (×2): 81 mg via ORAL
  Filled 2015-10-26 (×2): qty 1

## 2015-10-26 MED ORDER — HYDRALAZINE HCL 20 MG/ML IJ SOLN
INTRAMUSCULAR | Status: AC
  Filled 2015-10-26: qty 1

## 2015-10-26 MED ORDER — ALTEPLASE 2 MG IJ SOLR
INTRAMUSCULAR | Status: AC
  Filled 2015-10-26: qty 4

## 2015-10-26 MED ORDER — LIDOCAINE-EPINEPHRINE (PF) 1 %-1:200000 IJ SOLN
INTRAMUSCULAR | Status: DC | PRN
  Administered 2015-10-26: 10 mL via INTRADERMAL

## 2015-10-26 MED ORDER — HEPARIN SODIUM (PORCINE) 1000 UNIT/ML IJ SOLN
INTRAMUSCULAR | Status: AC
Start: 2015-10-26 — End: 2015-10-26
  Filled 2015-10-26: qty 1

## 2015-10-26 MED ORDER — HYDROMORPHONE HCL 1 MG/ML IJ SOLN
1.0000 mg | Freq: Once | INTRAMUSCULAR | Status: AC
  Administered 2015-10-26: 1 mg via INTRAVENOUS

## 2015-10-26 MED ORDER — HYDRALAZINE HCL 20 MG/ML IJ SOLN
INTRAMUSCULAR | Status: DC | PRN
  Administered 2015-10-26: 20 mg via INTRAVENOUS

## 2015-10-26 MED ORDER — MORPHINE SULFATE (PF) 4 MG/ML IV SOLN
2.0000 mg | INTRAVENOUS | Status: DC | PRN
  Administered 2015-10-26 – 2015-10-27 (×11): 2 mg via INTRAVENOUS
  Filled 2015-10-26 (×10): qty 1

## 2015-10-26 MED ORDER — FLEET ENEMA 7-19 GM/118ML RE ENEM: 1.0000 | ENEMA | Freq: Once | RECTAL | Status: DC | PRN

## 2015-10-26 MED ORDER — LABETALOL HCL 5 MG/ML IV SOLN
INTRAVENOUS | Status: DC | PRN
  Administered 2015-10-26: 20 mg via INTRAVENOUS

## 2015-10-26 MED ORDER — APIXABAN 2.5 MG PO TABS
2.5000 mg | ORAL_TABLET | Freq: Two times a day (BID) | ORAL | Status: DC
  Administered 2015-10-27 – 2015-10-28 (×3): 2.5 mg via ORAL
  Filled 2015-10-26 (×3): qty 1

## 2015-10-26 MED ORDER — HYDROMORPHONE HCL 1 MG/ML IJ SOLN: 1.0000 mg | Freq: Once | INTRAMUSCULAR | Status: DC

## 2015-10-26 MED ORDER — HYDROCHLOROTHIAZIDE 12.5 MG PO CAPS
12.5000 mg | ORAL_CAPSULE | Freq: Every day | ORAL | Status: DC
  Administered 2015-10-27 – 2015-10-28 (×2): 12.5 mg via ORAL
  Filled 2015-10-26 (×2): qty 1

## 2015-10-26 MED ORDER — ONDANSETRON HCL 4 MG PO TABS: 4.0000 mg | ORAL_TABLET | Freq: Four times a day (QID) | ORAL | Status: DC | PRN

## 2015-10-26 SURGICAL SUPPLY — 23 items
BALLN LUTONIX DCB 5X80X130 (BALLOONS) ×3
BALLN ULTRVRSE 7X40X130C (BALLOONS) ×3
BALLOON LUTONIX DCB 5X80X130 (BALLOONS) ×1 IMPLANT
BALLOON ULTRVRSE 7X40X130C (BALLOONS) ×1 IMPLANT
CANISTER PENUMBRA MAX (MISCELLANEOUS) ×3 IMPLANT
CATH INDIGO 6 ST-TIP 135CM (CATHETERS) ×3 IMPLANT
CATH KA2 5FR 65CM (CATHETERS) ×3 IMPLANT
CATH PIG 70CM (CATHETERS) ×3 IMPLANT
DEVICE PRESTO INFLATION (MISCELLANEOUS) ×3 IMPLANT
DEVICE SAFEGUARD 24CM (GAUZE/BANDAGES/DRESSINGS) ×3 IMPLANT
DEVICE SOLENT PROXI 90CM (CATHETERS) ×3 IMPLANT
DEVICE STARCLOSE SE CLOSURE (Vascular Products) ×3 IMPLANT
GLIDEWIRE ADV .035X260CM (WIRE) ×3 IMPLANT
GUIDEWIRE SUPER STIFF .035X180 (WIRE) ×3 IMPLANT
PACK ANGIOGRAPHY (CUSTOM PROCEDURE TRAY) ×3 IMPLANT
SHEATH ANL2 6FRX45 HC (SHEATH) ×3 IMPLANT
SHEATH BRITE TIP 4FRX11 (SHEATH) ×3 IMPLANT
SHEATH BRITE TIP 5FRX11 (SHEATH) ×3 IMPLANT
SHEATH PINNACLE ST 6F 45CM (SHEATH) ×3 IMPLANT
SYR MEDRAD MARK V 150ML (SYRINGE) ×3 IMPLANT
TUBING ASPIRATION INDIGO (MISCELLANEOUS) ×3 IMPLANT
TUBING CONTRAST HIGH PRESS 72 (TUBING) ×3 IMPLANT
WIRE J 3MM .035X145CM (WIRE) ×3 IMPLANT

## 2015-10-26 NOTE — Progress Notes (Signed)
Dr Wyn Quakerew informed of family concern that patient is unable to get around well at home with current leg pain, attestation/MD signature needed for consent form,  GFR, and lung sounds. MD acknowledged, no new orders at this time.

## 2015-10-26 NOTE — H&P (Signed)
  [facility] History & Physical Update  The patient was interviewed and re-examined.  The patient's previous History and Physical has been reviewed and is unchanged.  There is no change in the plan of care. We plan to proceed with the scheduled procedure.  Boneta Standre, MD  10/26/2015, 10:03 AM

## 2015-10-26 NOTE — Progress Notes (Signed)
Dr. Wyn Quakerew clarified orders for patient to have PAD reduced to 20cc of air and to be deflated q2h and removed at 0600. Orders placed. Patient also no longer bedrest per Dr. Wyn Quakerew. Patient c/o pain in L leg still, morphine given and resting at this time. Will cont to assess. Trudee KusterBrandi R Mansfield

## 2015-10-26 NOTE — Op Note (Signed)
[facility] Percutaneous Study/Intervention Procedural Note   Date of Surgery: 10/26/2015  Surgeon(s):DEW,JASON   Assistants:none  Pre-operative Diagnosis: PAD with rest pain left lower extremity  Post-operative diagnosis: Same  Procedure(s) Performed: 1. Ultrasound guidance for vascular access right femoral artery 2. Catheter placement into left profunda femoris artery from right femoral approach 3. Aortogram and selective left lower extremity angiogram 4. Percutaneous transluminal angioplasty of right external iliac artery with 5 mm diameter drug-coated angioplasty balloon and 7 mm diameter conventional angioplasty balloon 5. Catheter directed thrombo-lysis with 4 mg of TPA delivered to the left common femoral artery and profunda femoris artery with the AngioJet proxy catheter in a power pulse spray fashion  6.  Mechanical thrombectomy to the left common femoral artery and profunda femoris artery with both the AngioJet proxy catheter and the Penumbra CAT 6 device  7.  Percutaneous transluminal angioplasty of the left profunda femoris artery with 5 mm diameter Lutonix drug-coated angioplasty balloon  8.  Percutaneous transluminal angioplasty of the left common femoral artery with 7 mm diameter angioplasty balloon 9. StarClose closure device right femoral artery  EBL: 25 cc  Contrast: 80 cc  Fluoro Time: 9.7 minutes  Moderate Conscious Sedation Time: approximately 60 minutes using 2 mg of Versed and 50 mcg of Fentanyl  Indications: Patient is a 82 y.o.male with acute on chronic ischemia of the left lower extremity now with rest pain for about 1 week. He had a known left femoral to popliteal bypass that has been long occluded on the left side. The patient is brought in for angiography for further evaluation and potential treatment. Risks and benefits are discussed and  informed consent is obtained  Procedure: The patient was identified and appropriate procedural time out was performed. The patient was then placed supine on the table and prepped and draped in the usual sterile fashion.Moderate conscious sedation was administered during a face to face encounter with the patient throughout the procedure with my supervision of the RN administering medicines and monitoring the patient's vital signs, pulse oximetry, telemetry and mental status throughout from the start of the procedure until the patient was taken to the recovery room. Ultrasound was used to evaluate the right common femoral artery. It was patent . A digital ultrasound image was acquired. A Seldinger needle was used to access the right common femoral artery under direct ultrasound guidance and a permanent image was performed. A 0.035 J wire was advanced without resistance and a 5Fr sheath was placed. Pigtail catheter was placed into the aorta and an AP aortogram was performed. This demonstrated normal renal arteries and a moderate-sized abdominal aortic aneurysm without extravasation. The left iliac system had sluggish flow due to the distal occlusion but no stenosis was seen in the left iliac system. In the right mid external iliac artery was about an 80-85% stenosis. I then crossed the aortic bifurcation and advanced to the left femoral head. Selective left lower extremity angiogram was then performed. This demonstrated Occlusion of the common femoral artery, profunda femoris artery, with known thrombosis of the femoral to popliteal bypass graft and native SFA. This appeared to be an acute on chronic situation with thrombosis in the common femoral and profunda femoris artery. Through collaterals, there was distal reconstitution in the popliteal artery with one-vessel runoff distally that was quite sluggish initially. The patient was systemically heparinized and I started by treating the right external iliac  artery stenosis before placing the larger sheath. This was treated with a 5  mm diameter by 8 cm length Lutonix drug-coated angioplasty balloon. This was inflated to 14 atm for 1 minute. Completion angiogram still showed about a 50-60% residual stenosis and I elected to go ahead and go up and over and treat the left side, but at the end of the procedure I performed a second inflation of the right external iliac artery with a 7 mm diameter by 4 cm length angioplasty balloon. Inflated to 10 atm for 1 minute. About a 20% residual stenosis with a slight nonflow limiting dissection was seen in the right external iliac artery and this did not require stent. At this point, I turned my attention to the left side and a 6 [place] Ansell sheath was then placed over the [employer] wire. I then used a Kumpe catheter and the advantage wire to navigate through the common femoral and profunda femoris artery occlusion and confirm intraluminal flow in the primary bifurcation of the profunda femoris artery beyond the occlusion. The wire was then replaced. With the acute on chronic nature of this situation, I elected to instill 4 mg of TPA in a power pulse spray fashion to the left common femoral artery and profunda femoris artery. The AngioJet proxy catheter was used and this was allowed to dwell for about 10 minutes. I then performed mechanical rheolytic thrombectomy with about 120 cc of effluent returned in the left common femoral artery and profunda femoris artery. There was still flow-limiting thrombus and stenosis and so I used a 5 mm diameter by 8 cm length Lutonix drug-coated angioplasty balloon to primarily treat the profunda femoris artery. This was inflated to 10 atm for 1 minute and was taken back into the common femoral artery. There was still a large blob of thrombus at the femoral bifurcation but the profunda femoris artery itself was now patent. I elected to exchange for the penumbra CAT 6 device and Court the  thrombus in the left common femoral artery and femoral bifurcation extracting a significant volume of thrombus in this location. This resulted in clearance of the thrombus at the femoral bifurcation but there were still narrowing in the mid left common femoral artery likely from a combination of stenosis and thrombus from the bypass graft. I upsized to a 7 mm diameter by 4 cm length balloon in the left common femoral artery taking care not to enter the profunda femoris artery. This was then inflated to 12 atm for 1 minute and the left common femoral artery. Following these procedures, there was about a 30-40% residual narrowing of the common femoral artery but brisk flow through the common femoral artery into the profunda femoris artery. I felt we had markedly improved his perfusion and we'll then keep him on anticoagulation to avoid recurrent thrombosis. I elected to terminate the procedure. The sheath was removed and StarClose closure device was deployed in the right femoral artery with excellent hemostatic result. The patient was taken to the recovery room in stable condition having tolerated the procedure well.  Findings:  Aortogram: normal renal arteries and a moderate-sized abdominal aortic aneurysm without extravasation. The left iliac system had sluggish flow due to the distal occlusion but no stenosis was seen in the left iliac system. In the right mid external iliac artery was about an 80-85% stenosis Left Lower Extremity: Occlusion of the common femoral artery, profunda femoris artery, with known thrombosis of the femoral to popliteal bypass graft and native SFA. This appeared to be an acute on chronic situation with thrombosis in the common  femoral and profunda femoris artery. Through collaterals, there was distal reconstitution in the popliteal artery with one-vessel runoff distally that was quite sluggish initially.   Disposition: Patient was taken to the recovery room  in stable condition having tolerated the procedure well.  Complications: None  DEW,JASON 10/26/2015 12:02 PM

## 2015-10-26 NOTE — Therapy (Signed)
Attempted to educate patient on IS. Patient unavailable. Equipment left in room for later instruction

## 2015-10-27 DIAGNOSIS — I70202 Unspecified atherosclerosis of native arteries of extremities, left leg: Secondary | ICD-10-CM | POA: Diagnosis not present

## 2015-10-27 LAB — CBC
HCT: 28.1 % — ABNORMAL LOW (ref 40.0–52.0)
Hemoglobin: 9.9 g/dL — ABNORMAL LOW (ref 13.0–18.0)
MCH: 31.2 pg (ref 26.0–34.0)
MCHC: 35.1 g/dL (ref 32.0–36.0)
MCV: 89 fL (ref 80.0–100.0)
PLATELETS: 164 10*3/uL (ref 150–440)
RBC: 3.16 MIL/uL — ABNORMAL LOW (ref 4.40–5.90)
RDW: 14.4 % (ref 11.5–14.5)
WBC: 6.5 10*3/uL (ref 3.8–10.6)

## 2015-10-27 LAB — BASIC METABOLIC PANEL
Anion gap: 6 (ref 5–15)
BUN: 48 mg/dL — AB (ref 6–20)
CHLORIDE: 111 mmol/L (ref 101–111)
CO2: 20 mmol/L — AB (ref 22–32)
CREATININE: 1.77 mg/dL — AB (ref 0.61–1.24)
Calcium: 8.8 mg/dL — ABNORMAL LOW (ref 8.9–10.3)
GFR calc Af Amer: 39 mL/min — ABNORMAL LOW (ref >60)
GFR calc non Af Amer: 34 mL/min — ABNORMAL LOW (ref >60)
Glucose, Bld: 136 mg/dL — ABNORMAL HIGH (ref 65–99)
Potassium: 4.2 mmol/L (ref 3.5–5.1)
Sodium: 137 mmol/L (ref 135–145)

## 2015-10-27 MED ORDER — SODIUM CHLORIDE 0.9% FLUSH
3.0000 mL | INTRAVENOUS | Status: DC | PRN
  Administered 2015-10-27 (×3): 3 mL via INTRAVENOUS
  Filled 2015-10-27 (×3): qty 3

## 2015-10-27 NOTE — Evaluation (Signed)
Physical Therapy Evaluation Patient Details Name: Devin Cooley MRN: [id number] DOB: 1929-07-21 Today's Date: 10/27/2015   History of Present Illness  Pt is an 82 y.o. male presenting with acute on chronic ischemia L LE rest pain x1 week.  Pt s/p L leg revascularization 10/26/15 and seen POD#1 for evaluation.  PMH includes L fem-pop bypass, htn, PAD, h/o CEA, h/o GI bleed, blood clot.  Clinical Impression  Prior to admission, pt was modified independent with functional mobility ambulating with SPC.  Pt lives with his son and daughter in law in 1 level home with 7-12 STE with railings.  Currently pt is SBA supine to sit (with HOB significantly elevated and significant increased time required) and CGA with transfers and ambulation a few feet bed to chair with RW.  Limited distance ambulating d/t pt with significant SOB with limited distance (O2 96% at rest but decreased to 88% post ambulation and returned to 97% after sitting rest break; pt on room air throughout session).  Pt noted to demonstrate limited WB'ing through L LE d/t pain and was very guarded with movements d/t L LE pain with any touch.  Pt would benefit from skilled PT to address noted impairments and functional limitations.  Recommend pt discharge to STR when medically appropriate.    Follow Up Recommendations SNF    Equipment Recommendations   (Pt has RW at home already)    Recommendations for Other Services       Precautions / Restrictions Precautions Precautions: Fall Restrictions Weight Bearing Restrictions: No      Mobility  Bed Mobility Overal bed mobility: Needs Assistance Bed Mobility: Supine to Sit     Supine to sit: Supervision;HOB elevated     General bed mobility comments: pt bringing HOB completely to upright and then taking significant extra time (especially to move L LE) but able to sit on edge of bed without any physical assist once bed set up in this position  Transfers Overall transfer level: Needs  assistance Equipment used: Rolling walker (2 wheeled) Transfers: Sit to/from Stand Sit to Stand: Min guard         General transfer comment: increased effort to stand noted (with decreased WB'ing through L LE); vc's required for hand and feet positioning  Ambulation/Gait Ambulation/Gait assistance: Min guard Ambulation Distance (Feet): 3 Feet (bed to chair) Assistive device: Rolling walker (2 wheeled)   Gait velocity: decreased   General Gait Details: minimal WB'ing noted through L LE; significant shortness of breath noted with limited ambulation distance to chair; vc's required for gait technique  Stairs            Wheelchair Mobility    Modified Rankin (Stroke Patients Only)       Balance Overall balance assessment: Needs assistance Sitting-balance support: No upper extremity supported;Feet supported Sitting balance-Leahy Scale: Good     Standing balance support: Bilateral upper extremity supported (on RW) Standing balance-Leahy Scale: Fair                               Pertinent Vitals/Pain Pain Assessment: Faces Faces Pain Scale: Hurts even more (pt denies any pain at rest but L LE appears painful to touch or movement) Pain Location: L lower leg Pain Descriptors / Indicators: Sore;Tender Pain Intervention(s): Limited activity within patient's tolerance;Monitored during session;Premedicated before session;Repositioned    Home Living Family/patient expects to be discharged to:: Private residence Living Arrangements: Children (Son and daughter-in-law) Available Help at  Discharge: Family Type of Home: House Home Access: Stairs to enter   Entergy CorporationEntrance Stairs-Number of Steps: 12 from the front and 7 from the back (all with B rails) Home Layout: One level Home Equipment: Cane - single point;Walker - 2 wheels;Bedside commode      Prior Function Level of Independence: Independent with assistive device(s)         Comments: Pt reports using SPC  for past 7 months.  Pt denies any falls in past 6 months.     Hand Dominance        Extremity/Trunk Assessment   Upper Extremity Assessment: Generalized weakness           Lower Extremity Assessment: RLE deficits/detail;LLE deficits/detail RLE Deficits / Details: appears at least 3+/5 grossly R LE; ROM appears WFL LLE Deficits / Details: L DF to 20 degrees short of neutral (limited DF d/t pain in L lower leg); Pt declining to bend L knee d/t pain but able to perform limited range L LE SLR independently  Cervical / Trunk Assessment: Normal  Communication   Communication: No difficulties  Cognition Arousal/Alertness: Awake/alert Behavior During Therapy: WFL for tasks assessed/performed Overall Cognitive Status: Within Functional Limits for tasks assessed                      General Comments General comments (skin integrity, edema, etc.): Pt ok with PT touching bottom of L foot but requesting not to touch rest of L lower leg d/t pain with touch. Nursing cleared pt for participation in physical therapy and reports pt is cleared to get OOB and mobilize.  Pt agreeable to PT session.    Exercises        Assessment/Plan    PT Assessment Patient needs continued PT services  PT Diagnosis Difficulty walking;Acute pain   PT Problem List Decreased strength;Decreased range of motion;Decreased activity tolerance;Decreased balance;Decreased mobility;Decreased knowledge of use of DME;Cardiopulmonary status limiting activity;Pain  PT Treatment Interventions DME instruction;Gait training;Stair training;Functional mobility training;Therapeutic activities;Therapeutic exercise;Balance training;Patient/family education   PT Goals (Current goals can be found in the Care Plan section) Acute Rehab PT Goals Patient Stated Goal: to have less L LE pain PT Goal Formulation: With patient Time For Goal Achievement: 11/10/15 Potential to Achieve Goals: Fair    Frequency Min 2X/week    Barriers to discharge        Co-evaluation               End of Session Equipment Utilized During Treatment: Gait belt Activity Tolerance: Patient limited by fatigue Patient left: in chair;with call bell/phone within reach;with chair alarm set (B LE's elevated on pillow) Nurse Communication: Mobility status;Precautions;Other (comment) (L LE pain; O2 desaturation with activity)    Functional Assessment Tool Used: AM-PAC without stairs Functional Limitation: Mobility: Walking and moving around Mobility: Walking and Moving Around Current Status ([id number]): At least 40 percent but less than 60 percent impaired, limited or restricted Mobility: Walking and Moving Around Goal Status [phone]): At least 1 percent but less than 20 percent impaired, limited or restricted    Time: [id number] PT Time Calculation (min) (ACUTE ONLY): 25 min   Charges:   PT Evaluation $PT Eval Low Complexity: 1 Procedure     PT G Codes:   PT G-Codes **NOT FOR INPATIENT CLASS** Functional Assessment Tool Used: AM-PAC without stairs Functional Limitation: Mobility: Walking and moving around Mobility: Walking and Moving Around Current Status ([id number]): At least 40 percent but less than 60 percent impaired, limited  or restricted Mobility: Walking and Moving Around Goal Status [phone]): At least 1 percent but less than 20 percent impaired, limited or restricted    Hendricks Limes 10/27/2015, 5:11 PM Hendricks Limes, PT [phone]

## 2015-10-27 NOTE — Progress Notes (Signed)
[facility]  Daily Progress Note   Subjective  - 1 Day Post-Op  Pain better No walking yet No major events overnight  Objective Filed Vitals:   10/27/15 0001 10/27/15 0457 10/27/15 0755 10/27/15 1133  BP: 110/56 113/59 106/62 107/50  Pulse: 77 80 75 79  Temp: 98 F (36.7 C) 98.1 F (36.7 C)  98.2 F (36.8 C)  TempSrc:    Oral  Resp: 18 16 18 17   Height:      Weight:  77.474 kg (170 lb 12.8 oz)    SpO2: 94% 95% 92% 95%    Intake/Output Summary (Last 24 hours) at 10/27/15 1452 Last data filed at 10/27/15 1408  Gross per 24 hour  Intake 970.03 ml  Output   1445 ml  Net -474.97 ml    PULM  CTAB CV  RRR VASC  Left foot warm, good cap refill  Laboratory CBC    Component Value Date/Time   WBC 6.5 10/27/2015 0405   WBC 7.6 07/11/2014 0919   HGB 9.9* 10/27/2015 0405   HGB 5.3* 07/11/2014 0919   HCT 28.1* 10/27/2015 0405   HCT 17.0* 07/11/2014 0919   PLT 164 10/27/2015 0405   PLT 145* 07/11/2014 0919    BMET    Component Value Date/Time   NA 137 10/27/2015 0405   NA 133* 07/11/2014 0919   K 4.2 10/27/2015 0405   K 4.8 07/11/2014 0919   CL 111 10/27/2015 0405   CL 110 07/11/2014 0919   CO2 20* 10/27/2015 0405   CO2 11* 07/11/2014 0919   GLUCOSE 136* 10/27/2015 0405   GLUCOSE 256* 07/11/2014 0919   BUN 48* 10/27/2015 0405   BUN 91* 07/11/2014 0919   CREATININE 1.77* 10/27/2015 0405   CREATININE 3.11* 07/11/2014 0919   CALCIUM 8.8* 10/27/2015 0405   CALCIUM 8.3* 07/11/2014 0919   GFRNONAA 34* 10/27/2015 0405   GFRNONAA 18* 07/11/2014 0919   GFRNONAA 58* 03/03/2014 0504   GFRAA 39* 10/27/2015 0405   GFRAA 21* 07/11/2014 0919   GFRAA >60 03/03/2014 0504    Assessment/Planning: POD #1 s/p left leg revascularization   Doing well, but has not walked or gotten out of bed yet  Stop Aggrastat. Start Eliquis  Ambulate.  Saline lock  Labs OK  Likely home tomorrow    DEW,JASON  10/27/2015, 2:52 PM

## 2015-10-28 DIAGNOSIS — I70202 Unspecified atherosclerosis of native arteries of extremities, left leg: Secondary | ICD-10-CM | POA: Diagnosis not present

## 2015-10-28 MED ORDER — APIXABAN 2.5 MG PO TABS: 2.5000 mg | ORAL_TABLET | Freq: Two times a day (BID) | ORAL | Status: AC

## 2015-10-28 MED ORDER — HYDROCODONE-ACETAMINOPHEN 5-325 MG PO TABS: 1.0000 | ORAL_TABLET | Freq: Every day | ORAL | Status: AC

## 2015-10-28 NOTE — Progress Notes (Signed)
Patient is being discharge to rehab in a stable condition, report given to floor nurse , and pt with his son verbalized understanding , left with son

## 2015-10-28 NOTE — Discharge Summary (Signed)
[facility]    Discharge Summary    Patient ID:  Devin Cooley MRN: [id number] DOB/AGE: [birth year] 80 y.o.  Admit date: 10/26/2015 Discharge date: 10/28/2015 Date of Surgery: 10/26/2015 Surgeon: Surgeon(s): Annice Needy, MD  Admission Diagnosis: LT lower extremity angio    ASO w rest pain  Discharge Diagnoses:  LT lower extremity angio    ASO w rest pain  Secondary Diagnoses: Past Medical History  Diagnosis Date  . Hypertension   . PAD (peripheral artery disease) (HCC)     unknown date  . H/O: GI bleed     unknown date  . Hyperlipidemia     Procedure(s): Lower Extremity Angiography  Discharged Condition: good  HPI:  Patient with ischemia of left leg with rest pain.  Brought in for revascularization for limb salvage  Hospital Course:  Devin Cooley is a 82 y.o. male is S/P Left Procedure(s): Lower Extremity Angiography Extubated: N/A Physical exam: left foot warm, good cap refill.  AF/VSS Post-op wounds healing well Pt. Ambulating, voiding and taking PO diet without difficulty. Pt pain controlled with PO pain meds. Labs as below Complications:none  Consults:     Significant Diagnostic Studies: CBC Lab Results  Component Value Date   WBC 6.5 10/27/2015   HGB 9.9* 10/27/2015   HCT 28.1* 10/27/2015   MCV 89.0 10/27/2015   PLT 164 10/27/2015    BMET    Component Value Date/Time   NA 137 10/27/2015 0405   NA 133* 07/11/2014 0919   K 4.2 10/27/2015 0405   K 4.8 07/11/2014 0919   CL 111 10/27/2015 0405   CL 110 07/11/2014 0919   CO2 20* 10/27/2015 0405   CO2 11* 07/11/2014 0919   GLUCOSE 136* 10/27/2015 0405   GLUCOSE 256* 07/11/2014 0919   BUN 48* 10/27/2015 0405   BUN 91* 07/11/2014 0919   CREATININE 1.77* 10/27/2015 0405   CREATININE 3.11* 07/11/2014 0919   CALCIUM 8.8* 10/27/2015 0405   CALCIUM 8.3* 07/11/2014 0919   GFRNONAA 34* 10/27/2015 0405   GFRNONAA 18* 07/11/2014 0919   GFRNONAA 58* 03/03/2014 0504   GFRAA 39* 10/27/2015 0405   GFRAA 21* 07/11/2014 0919   GFRAA >60 03/03/2014 0504   COAG Lab Results  Component Value Date   INR 1.20 10/20/2015   INR 1.5 07/11/2014   INR 1.4 03/01/2014     Disposition:  Discharge to :Rehab, patient too weak for discharge home per PT Discharge Instructions    Discharge patient    Complete by:  As directed             Medication List    TAKE these medications        apixaban 2.5 MG Tabs tablet  Commonly known as:  ELIQUIS  Take 1 tablet (2.5 mg total) by mouth 2 (two) times daily.     aspirin 81 MG tablet  Take 81 mg by mouth daily.     atorvastatin 80 MG tablet  Commonly known as:  LIPITOR  Take 80 mg by mouth daily. Reported on 10/25/2015     HYDROcodone-acetaminophen 5-325 MG tablet  Commonly known as:  NORCO/VICODIN  Take 1-2 tablets by mouth daily.     lisinopril-hydrochlorothiazide 20-12.5 MG tablet  Commonly known as:  PRINZIDE,ZESTORETIC  Take 1 tablet by mouth daily.     VITAMIN D PO  Take 1 capsule by mouth daily.       Verbal and written Discharge instructions given to the patient. Wound care per  Discharge AVS     Follow-up Information    Follow up with DEW,JASON, MD In 3 weeks.   Specialties:  Vascular Surgery, Radiology, Interventional Cardiology   Why:  with ABIs   Contact information:   [address] [place] [address] [phone]       Signed: Festus BarrenEW,JASON, MD  10/28/2015, 1:36 PM

## 2015-10-28 NOTE — Care Management Obs Status (Signed)
MEDICARE OBSERVATION STATUS NOTIFICATION   Patient Details  Name: Devin Cooley MRN: [id number] Date of Birth: 1930/02/02   Medicare Observation Status Notification Given:  Yes    Marily MemosLisa M Deyonte Cadden, RN 10/28/2015, 10:07 AM

## 2015-10-28 NOTE — Progress Notes (Signed)
CSW received insurance authorization. Auth # [id number] Informed Jomarie LongsJoseph- Admissions Coordinator at [facility].   Devin Cooley, MSW, LCSW-A, LCAS-A Clinical Social Worker [phone]

## 2015-10-28 NOTE — Clinical Social Work Note (Signed)
Clinical Social Work Assessment  Patient Details  Name: Devin Cooley MRN: [id number] Date of Birth: 1929/07/20  Date of referral:  10/28/15               Reason for consult:  Discharge Planning                Permission sought to share information with:  Family Supports Permission granted to share information::  Yes, Verbal Permission Granted  Name::        Agency::     Relationship::   Elita Quick- Son)  Sport and exercise psychologist Information:     Housing/Transportation Living arrangements for the past 2 months:  Single Family Home Source of Information:  Patient Patient Interpreter Needed:  None Criminal Activity/Legal Involvement Pertinent to Current Situation/Hospitalization:  No - Comment as needed Significant Relationships:  Adult Children, Other Family Members Lives with:  Adult Children Do you feel safe going back to the place where you live?  Yes Need for family participation in patient care:  Yes (Comment) Elita Quick- Son)  Care giving concerns:  PT recommends that patient would benefit from SNF placement. Patient and his son are in agreement.    Social Worker assessment / plan:  CSW met with patient and his son at bedside. CSW introduced herself and her role. CSW informed patient and his son that PT feels he will benefit from SNF placement. Patient and his son are agreeable. Per patient he lives home with his son and his daughter. He reported that he feels weak. Reported that he's typically independent. Reported they live in [place], [place] and would be willing to go to SNF in [place] or [place]. Granted CSW verbal permission to send SNF referral to SNFs in [place]. FL2/ PASRR completed, placed in MDs basket for cosign. Awaiting bed offers.   CSW presented bed offers. Accepted bed at [facility]. CSW informed Broadus John- Admissions Coordinator at [facility]. Informed Broadus John that patient will discharge today. Contacted [facility] to get authorization. Awaiting insurance auth.   Clinical Social Worker was  informed that patient will be medically ready to discharge to [facility]. Patient and son are in a agreement with plan. CSW called Jospeh- Admissions Coordinator at [facility] to confirm that patient's bed is ready. Provided patient's room number 505 and number to call for report Reggie Pile [phone] . All discharge information faxed to [facility] via [facility]. Rx's added to discharge packet.  RN will call report and patient will discharge to [facility] via [place]- son.   Employment status:  Retired Nurse, adult PT Recommendations:  [place] / Referral to community resources:  [place]  Patient/Family's Response to care:  Patient and his son are in agreement for him to discharge to [facility] today.   Patient/Family's Understanding of and Emotional Response to Diagnosis, Current Treatment, and Prognosis:  Patient and his son verbalized they understand his diagnosis, current treatment and prognosis. Thanked CSW for her assistance.   Emotional Assessment Appearance:  Appears stated age Attitude/Demeanor/Rapport:   (None) Affect (typically observed):  Calm, Pleasant, Accepting Orientation:  Oriented to Self, Oriented to Place, Oriented to  Time, Oriented to Situation Alcohol / Substance use:  Not Applicable Psych involvement (Current and /or in the community):  No (Comment)  Discharge Needs  Concerns to be addressed:  Discharge Planning Concerns Readmission within the last 30 days:  No Current discharge risk:  None Barriers to Discharge:  No Barriers Identified   [place]. [place], LCSW 10/28/2015, 1:52 PM

## 2015-10-28 NOTE — Clinical Social Work Placement (Signed)
   CLINICAL SOCIAL WORK PLACEMENT  NOTE  Date:  10/28/2015  Patient Details  Name: Devin Cooley MRN: [id number] Date of Birth: [birth year]  Clinical Social Work is seeking post-discharge placement for this patient at the Skilled  Nursing Facility level of care (*CSW will initial, date and re-position this form in  chart as items are completed):  Yes   Patient/family provided with [facility] Clinical Social Work Department's list of facilities offering this level of care within the geographic area requested by the patient (or if unable, by the patient's family).  Yes   Patient/family informed of their freedom to choose among providers that offer the needed level of care, that participate in Medicare, Medicaid or managed care program needed by the patient, have an available bed and are willing to accept the patient.  Yes   Patient/family informed of 's ownership interest in [facility] and [facility], as well as of the fact that they are under no obligation to receive care at these facilities.  PASRR submitted to EDS on 10/28/15     PASRR number received on 10/28/15     Existing PASRR number confirmed on       FL2 transmitted to all facilities in geographic area requested by pt/family on 10/28/15     FL2 transmitted to all facilities within larger geographic area on 10/28/15     Patient informed that his/her managed care company has contracts with or will negotiate with certain facilities, including the following:        Yes   Patient/family informed of bed offers received.  Patient chooses bed at  ([facility])     Physician recommends and patient chooses bed at      Patient to be transferred to  ([facility]) on 10/28/15.  Patient to be transferred to facility by  Shari Heritage(SonLars Mage- [place])     Patient family notified on 10/28/15 of transfer.  Name of family member notified:   Lars Mage(Christohper- Son)     PHYSICIAN       Additional Comment:     _______________________________________________ Idamae Lusherhristina E Dajana Gehrig, LCSW 10/28/2015, 1:53 PM

## 2015-10-28 NOTE — [place] FL2 (Signed)
[place] MEDICAID FL2 LEVEL OF CARE SCREENING TOOL     IDENTIFICATION  Patient Name: Devin Cooley Birthdate: 07-27-1929 Sex: male Admission Date (Current Location): 10/26/2015  [place]   Facility and Address:  [facility], [address], [place], [place] [address]      Provider Number: [id number]  Attending Physician Name and Address:  Annice NeedyJason S Dew, MD  Relative Name and Phone Number:       Current Level of Care: Hospital Recommended Level of Care: Skilled Nursing Facility Prior Approval Number:    Date Approved/Denied:   PASRR Number:  [id number])  Discharge Plan: SNF    Current Diagnoses: Patient Active Problem List   Diagnosis Date Noted  . Ischemic leg 10/26/2015  . Essential hypertension 08/25/2014  . Hyperlipidemia 08/25/2014  . PAD (peripheral artery disease) (HCC) 08/25/2014  . H/O carotid endarterectomy 08/25/2014    Orientation RESPIRATION BLADDER Height & Weight     Self, Time, Situation, Place  Normal Continent Weight: 171 lb 9.6 oz (77.837 kg) Height:  5\' 10"  (177.8 cm)  BEHAVIORAL SYMPTOMS/MOOD NEUROLOGICAL BOWEL NUTRITION STATUS   (None)  (None) Continent Diet (heart healthy/carb modified )  AMBULATORY STATUS COMMUNICATION OF NEEDS Skin   Extensive Assist Verbally Normal                       Personal Care Assistance Level of Assistance  Bathing, Feeding, Dressing Bathing Assistance: Limited assistance Feeding assistance: Independent Dressing Assistance: Limited assistance     Functional Limitations Info  Hearing, Speech, Sight Sight Info: Adequate Hearing Info: Adequate Speech Info: Adequate    SPECIAL CARE FACTORS FREQUENCY  PT (By licensed PT), OT (By licensed OT)     PT Frequency:  (5) OT Frequency:  (3)            Contractures      Additional Factors Info  Code Status, Allergies Code Status Info:  (Full Code) Allergies Info:  (No Known Allergies)           Current Medications (10/28/2015):  This is the current hospital active medication list Current Facility-Administered Medications  Medication Dose Route Frequency Provider Last Rate Last Dose  . acetaminophen (TYLENOL) tablet 650 mg  650 mg Oral Q6H PRN Annice NeedyJason S Dew, MD   650 mg at 10/27/15 2220   Or  . acetaminophen (TYLENOL) suppository 650 mg  650 mg Rectal Q6H PRN Annice NeedyJason S Dew, MD      . apixaban Everlene Balls(ELIQUIS) tablet 2.5 mg  2.5 mg Oral BID Annice NeedyJason S Dew, MD   2.5 mg at 10/28/15 0918  . aspirin EC tablet 81 mg  81 mg Oral Daily Annice NeedyJason S Dew, MD   81 mg at 10/28/15 [phone]  . atorvastatin (LIPITOR) tablet 80 mg  80 mg Oral Daily Annice NeedyJason S Dew, MD   80 mg at 10/27/15 2338  . cholecalciferol (VITAMIN D) tablet 1,000 Units  1,000 Units Oral Daily Annice NeedyJason S Dew, MD   1,000 Units at 10/28/15 [phone]  . docusate sodium (COLACE) capsule 100 mg  100 mg Oral BID Annice NeedyJason S Dew, MD   100 mg at 10/28/15 [phone]  . lisinopril (PRINIVIL,ZESTRIL) tablet 20 mg  20 mg Oral Daily Annice NeedyJason S Dew, MD   20 mg at 10/28/15 [phone]   And  . hydrochlorothiazide (MICROZIDE) capsule 12.5 mg  12.5 mg Oral Daily Annice NeedyJason S Dew, MD   12.5 mg at 10/28/15 0918  . HYDROcodone-acetaminophen (NORCO/VICODIN) 5-325  MG per tablet 1-2 tablet  1-2 tablet Oral Daily Annice Needy, MD   2 tablet at 10/28/15 [phone]  . HYDROmorphone (DILAUDID) injection 1 mg  1 mg Intravenous Once Annice Needy, MD   1 mg at 10/26/15 0930  . magnesium hydroxide (MILK OF MAGNESIA) suspension 30 mL  30 mL Oral Daily PRN Annice Needy, MD      . morphine 4 MG/ML injection 2 mg  2 mg Intravenous Q1H PRN Annice Needy, MD   2 mg at 10/27/15 2344  . ondansetron (ZOFRAN) tablet 4 mg  4 mg Oral Q6H PRN Annice Needy, MD       Or  . ondansetron [facility]) injection 4 mg  4 mg Intravenous Q6H PRN Annice Needy, MD      . sodium chloride flush (NS) 0.9 % injection 3 mL  3 mL Intravenous Q12H Annice Needy, MD   3 mL at 10/28/15 0918  . sodium chloride flush (NS) 0.9 % injection 3 mL  3 mL Intravenous PRN  Annice Needy, MD   3 mL at 10/27/15 2345  . sodium phosphate (FLEET) 7-19 GM/118ML enema 1 enema  1 enema Rectal Once PRN Annice Needy, MD      . sorbitol 70 % solution 30 mL  30 mL Oral Daily PRN Annice Needy, MD         Discharge Medications: Please see discharge summary for a list of discharge medications.  Relevant Imaging Results:  Relevant Lab Results:   Additional Information  (SSN [id number])  Verta Ellen Kathrynne Kulinski, LCSW

## 2015-10-28 NOTE — Progress Notes (Signed)
PT Cancellation Note  Patient Details Name: Devin Cooley MRN: [id number] DOB: 02-03-1930   Cancelled Treatment:    Reason Eval/Treat Not Completed: Other (comment).  Attempted to see pt for physical therapy treatment session but shortly after entering pt's room at 11am, pt's hospital meal arrived and pt and pt's son requesting for pt to eat lunch instead.  Will re-attempt PT treatment session at a later date/time as able.   [name] 10/28/2015, 11:09 AM [name], PT [phone]

## 2015-11-28 ENCOUNTER — Encounter: Payer: Self-pay | Admitting: Vascular Surgery

## 2015-11-28 ENCOUNTER — Encounter
Admission: RE | Disposition: A | Discharge status: Home / Self Care | Payer: Self-pay | Source: Ambulatory Visit | Attending: Vascular Surgery

## 2015-11-28 ENCOUNTER — Ambulatory Visit
Admission: RE | Admit: 2015-11-28 | Discharge: 2015-11-28 | Disposition: A | Discharge status: Home / Self Care | Payer: Commercial Managed Care - HMO | Source: Ambulatory Visit | Attending: Vascular Surgery | Admitting: Vascular Surgery

## 2015-11-28 DIAGNOSIS — R531 Weakness: Secondary | ICD-10-CM | POA: Insufficient documentation

## 2015-11-28 DIAGNOSIS — M7989 Other specified soft tissue disorders: Secondary | ICD-10-CM | POA: Insufficient documentation

## 2015-11-28 DIAGNOSIS — M2550 Pain in unspecified joint: Secondary | ICD-10-CM | POA: Insufficient documentation

## 2015-11-28 DIAGNOSIS — I739 Peripheral vascular disease, unspecified: Secondary | ICD-10-CM | POA: Insufficient documentation

## 2015-11-28 DIAGNOSIS — Z7982 Long term (current) use of aspirin: Secondary | ICD-10-CM | POA: Diagnosis not present

## 2015-11-28 DIAGNOSIS — M792 Neuralgia and neuritis, unspecified: Secondary | ICD-10-CM | POA: Diagnosis not present

## 2015-11-28 DIAGNOSIS — I10 Essential (primary) hypertension: Secondary | ICD-10-CM | POA: Diagnosis not present

## 2015-11-28 DIAGNOSIS — M79609 Pain in unspecified limb: Secondary | ICD-10-CM | POA: Diagnosis not present

## 2015-11-28 DIAGNOSIS — E7800 Pure hypercholesterolemia, unspecified: Secondary | ICD-10-CM | POA: Insufficient documentation

## 2015-11-28 DIAGNOSIS — I70222 Atherosclerosis of native arteries of extremities with rest pain, left leg: Secondary | ICD-10-CM | POA: Insufficient documentation

## 2015-11-28 DIAGNOSIS — I6529 Occlusion and stenosis of unspecified carotid artery: Secondary | ICD-10-CM | POA: Diagnosis not present

## 2015-11-28 HISTORY — PX: PERIPHERAL VASCULAR CATHETERIZATION: SHX172C

## 2015-11-28 LAB — CREATININE, SERUM
Creatinine, Ser: 2.43 mg/dL — ABNORMAL HIGH (ref 0.61–1.24)
GFR calc non Af Amer: 23 mL/min — ABNORMAL LOW (ref >60)
GFR, EST AFRICAN AMERICAN: 27 mL/min — AB (ref >60)

## 2015-11-28 LAB — BUN: BUN: 47 mg/dL — ABNORMAL HIGH (ref 6–20)

## 2015-11-28 SURGERY — LOWER EXTREMITY ANGIOGRAPHY
Anesthesia: Moderate Sedation | Site: Leg Lower | Laterality: Left

## 2015-11-28 MED ORDER — HEPARIN SODIUM (PORCINE) 1000 UNIT/ML IJ SOLN
INTRAMUSCULAR | Status: AC
  Filled 2015-11-28: qty 1

## 2015-11-28 MED ORDER — ONDANSETRON HCL 4 MG/2ML IJ SOLN: 4.0000 mg | Freq: Four times a day (QID) | INTRAMUSCULAR | Status: DC | PRN

## 2015-11-28 MED ORDER — OXYCODONE-ACETAMINOPHEN 5-325 MG PO TABS: 1.0000 | ORAL_TABLET | ORAL | Status: DC | PRN

## 2015-11-28 MED ORDER — METOPROLOL TARTRATE 5 MG/5ML IV SOLN
2.0000 mg | INTRAVENOUS | Status: DC | PRN
Start: 2015-11-28 — End: 2015-11-28

## 2015-11-28 MED ORDER — ACETAMINOPHEN 325 MG PO TABS: 325.0000 mg | ORAL_TABLET | ORAL | Status: DC | PRN

## 2015-11-28 MED ORDER — SODIUM CHLORIDE 0.9 % IJ SOLN
INTRAMUSCULAR | Status: AC
  Filled 2015-11-28: qty 50

## 2015-11-28 MED ORDER — HYDROMORPHONE HCL 1 MG/ML IJ SOLN
0.5000 mg | INTRAMUSCULAR | Status: DC | PRN
  Administered 2015-11-28: 1 mg via INTRAVENOUS

## 2015-11-28 MED ORDER — FENTANYL CITRATE (PF) 100 MCG/2ML IJ SOLN
INTRAMUSCULAR | Status: DC | PRN
  Administered 2015-11-28: 25 ug via INTRAVENOUS
  Administered 2015-11-28 (×2): 50 ug via INTRAVENOUS
  Administered 2015-11-28: 25 ug via INTRAVENOUS
  Administered 2015-11-28: 50 ug via INTRAVENOUS

## 2015-11-28 MED ORDER — HEPARIN SODIUM (PORCINE) 1000 UNIT/ML IJ SOLN
INTRAMUSCULAR | Status: DC | PRN
  Administered 2015-11-28: 4000 [IU] via INTRAVENOUS

## 2015-11-28 MED ORDER — GUAIFENESIN-DM 100-10 MG/5ML PO SYRP: 15.0000 mL | ORAL_SOLUTION | ORAL | Status: DC | PRN

## 2015-11-28 MED ORDER — PHENOL 1.4 % MT LIQD
1.0000 | OROMUCOSAL | Status: DC | PRN
Start: 2015-11-28 — End: 2015-11-28
  Filled 2015-11-28: qty 177

## 2015-11-28 MED ORDER — LIDOCAINE-EPINEPHRINE (PF) 1 %-1:200000 IJ SOLN
INTRAMUSCULAR | Status: AC
  Filled 2015-11-28: qty 30

## 2015-11-28 MED ORDER — MIDAZOLAM HCL 5 MG/5ML IJ SOLN
INTRAMUSCULAR | Status: AC
  Filled 2015-11-28: qty 5

## 2015-11-28 MED ORDER — DEXTROSE 5 % IV SOLN
1.5000 g | Freq: Once | INTRAVENOUS | Status: AC
  Administered 2015-11-28: 1.5 g via INTRAVENOUS

## 2015-11-28 MED ORDER — HYDRALAZINE HCL 20 MG/ML IJ SOLN: 5.0000 mg | INTRAMUSCULAR | Status: DC | PRN

## 2015-11-28 MED ORDER — NITROGLYCERIN 1 MG/10 ML FOR IR/CATH LAB
250.0000 ug | Freq: Once | INTRA_ARTERIAL | Status: AC
  Administered 2015-11-28: 250 ug via INTRA_ARTERIAL
  Filled 2015-11-28: qty 10

## 2015-11-28 MED ORDER — LABETALOL HCL 5 MG/ML IV SOLN: 10.0000 mg | INTRAVENOUS | Status: DC | PRN

## 2015-11-28 MED ORDER — DIPHENHYDRAMINE HCL 50 MG/ML IJ SOLN
INTRAMUSCULAR | Status: AC
  Filled 2015-11-28: qty 1

## 2015-11-28 MED ORDER — ACETAMINOPHEN 325 MG RE SUPP
325.0000 mg | RECTAL | Status: DC | PRN
  Filled 2015-11-28: qty 2

## 2015-11-28 MED ORDER — OXYCODONE-ACETAMINOPHEN 7.5-325 MG PO TABS: 1.0000 | ORAL_TABLET | ORAL | 0 refills | Status: AC | PRN

## 2015-11-28 MED ORDER — HYDROMORPHONE HCL 1 MG/ML IJ SOLN
INTRAMUSCULAR | Status: AC
  Administered 2015-11-28: 1 mg via INTRAVENOUS
  Filled 2015-11-28: qty 1

## 2015-11-28 MED ORDER — SODIUM CHLORIDE 0.9 % IV SOLN: 500.0000 mL | Freq: Once | INTRAVENOUS | Status: DC | PRN

## 2015-11-28 MED ORDER — MIDAZOLAM HCL 2 MG/2ML IJ SOLN
INTRAMUSCULAR | Status: DC | PRN
  Administered 2015-11-28 (×3): 1 mg via INTRAVENOUS
  Administered 2015-11-28: 2 mg via INTRAVENOUS

## 2015-11-28 MED ORDER — NITROGLYCERIN 5 MG/ML IV SOLN
INTRAVENOUS | Status: AC
  Filled 2015-11-28: qty 10

## 2015-11-28 MED ORDER — FENTANYL CITRATE (PF) 100 MCG/2ML IJ SOLN
INTRAMUSCULAR | Status: AC
  Filled 2015-11-28: qty 4

## 2015-11-28 MED ORDER — DIPHENHYDRAMINE HCL 50 MG/ML IJ SOLN
25.0000 mg | Freq: Once | INTRAMUSCULAR | Status: AC
  Administered 2015-11-28: 25 mg via INTRAVENOUS

## 2015-11-28 SURGICAL SUPPLY — 26 items
BALLN DORADO 6X200X135 (BALLOONS) ×3
BALLN LUTONIX 5X150X130 (BALLOONS) ×6
BALLN LUTONIX DCB 4X100X130 (BALLOONS) ×3
BALLN ULTRVRSE 018 2.5X100X150 (BALLOONS) ×3
BALLN ULTRVRSE 3X300X150 (BALLOONS) ×2
BALLN ULTRVRSE 3X300X150 OTW (BALLOONS) ×1
BALLOON DORADO 6X200X135 (BALLOONS) ×1 IMPLANT
BALLOON LUTONIX 5X150X130 (BALLOONS) ×2 IMPLANT
BALLOON LUTONIX DCB 4X100X130 (BALLOONS) ×1 IMPLANT
BALLOON ULTRVRSE 3X300X150 OTW (BALLOONS) ×1 IMPLANT
BALLOON ULTRVS 018 2.5X100X150 (BALLOONS) ×1 IMPLANT
CATH CXI SUPP ANG 4FR 135 (MICROCATHETER) ×1 IMPLANT
CATH CXI SUPP ANG 4FR 135CM (MICROCATHETER) ×3
CATH PIG 70CM (CATHETERS) ×3 IMPLANT
CATH VERT 100CM (CATHETERS) ×3 IMPLANT
DEVICE PRESTO INFLATION (MISCELLANEOUS) ×3 IMPLANT
DEVICE STARCLOSE SE CLOSURE (Vascular Products) ×3 IMPLANT
GLIDEWIRE ADV .035X260CM (WIRE) ×3 IMPLANT
PACK ANGIOGRAPHY (CUSTOM PROCEDURE TRAY) ×3 IMPLANT
SHEATH ANL2 6FRX45 HC (SHEATH) ×3 IMPLANT
SHEATH BRITE TIP 5FRX11 (SHEATH) ×3 IMPLANT
STENT VIABAHN 6X150X120 (Permanent Stent) ×3 IMPLANT
STENT VIABAHN 6X250X120 (Permanent Stent) ×3 IMPLANT
SYR MEDRAD MARK V 150ML (SYRINGE) ×3 IMPLANT
TUBING CONTRAST HIGH PRESS 72 (TUBING) ×3 IMPLANT
WIRE J 3MM .035X145CM (WIRE) ×3 IMPLANT

## 2015-11-28 NOTE — H&P (Signed)
  [facility] History & Physical Update  The patient was interviewed and re-examined.  The patient's previous History and Physical has been reviewed and is unchanged.  There is no change in the plan of care. We plan to proceed with the scheduled procedure.  DEW,JASON, MD  11/28/2015, 10:39 AM

## 2015-11-28 NOTE — Op Note (Signed)
[facility] Percutaneous Study/Intervention Procedural Note   Date of Surgery: 11/28/2015  Surgeon(s):Maneh Sieben   Assistants:none  Pre-operative Diagnosis: PAD with rest pain left lower extremity  Post-operative diagnosis: Same  Procedure(s) Performed: 1. Ultrasound guidance for vascular access right femoral artery 2. Catheter placement into left anterior tibial artery from right femoral approach 3. Aortogram and selective left lower extremity angiogram 4. Percutaneous transluminal angioplasty of the left anterior tibial artery with 3 mm diameter angioplasty balloon as well as a 2.5 mm diameter angioplasty balloon in the dorsalis pedis artery at the ankle and proximal foot and a 4 mm diameter Lutonix drug-coated angioplasty balloon in the proximal segment 5. Percutaneous transluminal angioplasty of the entire superficial femoral artery and the popliteal artery with 5 mm diameter angioplasty balloon including a 5 mm diameter Lutonix drug-coated angioplasty balloon proximally and distally  6.  Viabahn covered stent placement 2 to the SFA and popliteal arteries for greater than 50% residual stenosis after angioplasty using a 6 mm diameter by 25 cm length stent and a 6 mm diameter by 15 cm length stent 7. StarClose closure device right femoral artery  EBL: Minimal  Contrast: 80 cc  Fluoro Time: 13.8 minutes  Moderate Conscious Sedation Time: approximately 60 minutes using 5 mg of Versed and 200 mcg of Fentanyl  Indications: Patient is a 82 y.o.male with ischemic rest pain of the left foot despite multiple previous procedures. The patient has noninvasive study showing a markedly reduced ABIs of 0.2. The patient is brought in for angiography for further evaluation and potential treatment. Risks and benefits are discussed and informed consent is obtained  Procedure: The  patient was identified and appropriate procedural time out was performed. The patient was then placed supine on the table and prepped and draped in the usual sterile fashion.Moderate conscious sedation was administered during a face to face encounter with the patient throughout the procedure with my supervision of the RN administering medicines and monitoring the patient's vital signs, pulse oximetry, telemetry and mental status throughout from the start of the procedure until the patient was taken to the recovery room. Ultrasound was used to evaluate the right common femoral artery. It was patent . A digital ultrasound image was acquired. A Seldinger needle was used to access the right common femoral artery under direct ultrasound guidance and a permanent image was performed. A 0.035 J wire was advanced without resistance and a 5Fr sheath was placed. Pigtail catheter was placed into the aorta and an AP aortogram was performed. 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 I then crossed the aortic bifurcation and advanced to the left femoral head. Selective left lower extremity angiogram was then performed. This demonstrated the previously performed interventions on the common femoral artery and profunda femoris artery were widely patent. His previous bypass was occluded which was expected. The SFA was proximal for the first few centimeters but then occluded. There appeared to reconstitute popliteal artery above the knee and further occlusion was seen downstream and essentially no runoff was identified on initial imaging. The patient was systemically heparinized and a 6 [place] Ansell sheath was then placed over the [employer] wire. I then used a Kumpe catheter and the advantage wire to get into the superficial femoral artery and then crossed the  long occlusion. The advantage wire tracked into what was clearly the anterior  tibial artery and I was able to advance this down distally. I had to exchange for a CXI catheter and advance this all the way to the ankle were intraluminal flow was confirmed in the anterior tibial artery. I then replaced a 0.018 wire and proceeded with treatment. I took a 3 mm diameter by 30 cm length angioplasty balloon inflated this from just above the ankle up to the origin of the anterior tibial artery. This was inflated to 8 atm initially. I used this balloon to predilate the popliteal and SFA so that we can get a 035 balloon in as well. I then started with a 5 mm diameter by 15 cm length Lutonix drug-coated angioplasty balloon and treated the popliteal artery with an inflation to 8 atm for 1 minute. This balloon was pulled back into more inflations were used to treat the distal SFA and mid SFA with inflations to about 10 atm. The proximal SFA was treated with a new 5 mm diameter by 15 cm length Lutonix drug-coated angioplasty balloon inflated to 10 atm for 1 minute. Completion angiogram following this showed residual high-grade stenosis at the origin of the anterior tibial artery as well as in what was the proximal dorsalis pedis artery at the ankle and proximal foot as well as diffuse dissection and residual high-grade stenosis throughout the mid and distal superficial femoral artery and popliteal artery. For the SFA and popliteal artery two Viabahn stents. Were used to treat the residual stenosis and dissection. The first was a 6 mm diameter by 25 cm length stent from just below the knee up to the mid SFA and the second was a 6 mm diameter by 15 cm length stent was taken up to the proximal SFA about 5 cm below its origin. These were postdilated with a 5-6 mm balloon. The 6 mm balloon was a high pressure balloon as there was residual narrowing after conventional balloon inflation. This inflation was taken up to size 20-24 atm but  was within the covered stent. For the proximal anterior tibial artery and distal popliteal artery I used a 4 mm diameter by 10 cm length Lutonix drug-coated angioplasty balloon and inflated this to 12 atm for 1 minute. The distal anterior tibial artery and dorsalis pedis artery with then treated with a 2.5 mm diameter by 10 cm balloon to treat the residual disease at the foot and ankle area. At this point, completion imaging showed about a 20-25% residual stenosis within the stents in the SFA but this was not flow limiting. The anterior tibial artery had less than 20% residual stenosis throughout its course and had brisk flow into the foot. I felt we had markedly improved his perfusion at this point. I elected to terminate the procedure. The sheath was removed and StarClose closure device was deployed in the right femoral artery with excellent hemostatic result. The patient was taken to the recovery room in stable condition having tolerated the procedure well.  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 Left Lower Extremity: This demonstrated the previously performed interventions on the common femoral artery and profunda femoris artery were widely patent. His previous bypass was occluded which was expected. The SFA was proximal for the first few centimeters but then occluded. There appeared to reconstitute popliteal artery above the knee and further occlusion was seen downstream and essentially no runoff was identified  on initial imaging.   Disposition: Patient was taken to the recovery room in stable condition having tolerated the procedure well.  Complications: None  Darsh Vandevoort 11/28/2015 12:18 PM

## 2016-01-03 ENCOUNTER — Other Ambulatory Visit: Payer: Self-pay | Admitting: Vascular Surgery

## 2016-01-03 ENCOUNTER — Other Ambulatory Visit
Admission: RE | Admit: 2016-01-03 | Discharge: 2016-01-03 | Disposition: A | Discharge status: Home / Self Care | Payer: Commercial Managed Care - HMO | Source: Ambulatory Visit | Attending: Vascular Surgery | Admitting: Vascular Surgery

## 2016-01-03 DIAGNOSIS — Z01818 Encounter for other preprocedural examination: Secondary | ICD-10-CM | POA: Insufficient documentation

## 2016-01-03 DIAGNOSIS — I70229 Atherosclerosis of native arteries of extremities with rest pain, unspecified extremity: Secondary | ICD-10-CM | POA: Insufficient documentation

## 2016-01-03 LAB — CREATININE, SERUM
Creatinine, Ser: 1.69 mg/dL — ABNORMAL HIGH (ref 0.61–1.24)
GFR calc Af Amer: 41 mL/min — ABNORMAL LOW (ref >60)
GFR, EST NON AFRICAN AMERICAN: 35 mL/min — AB (ref >60)

## 2016-01-03 LAB — BUN: BUN: 39 mg/dL — ABNORMAL HIGH (ref 6–20)

## 2016-01-05 ENCOUNTER — Encounter: Admission: AD | Disposition: E | Payer: Self-pay | Source: Ambulatory Visit | Attending: Vascular Surgery

## 2016-01-05 ENCOUNTER — Inpatient Hospital Stay
Admission: AD | Admit: 2016-01-05 | Discharge: 2016-01-08 | DRG: 270 | Disposition: E | Payer: Commercial Managed Care - HMO | Source: Ambulatory Visit | Attending: Vascular Surgery | Admitting: Vascular Surgery

## 2016-01-05 DIAGNOSIS — I609 Nontraumatic subarachnoid hemorrhage, unspecified: Secondary | ICD-10-CM | POA: Diagnosis not present

## 2016-01-05 DIAGNOSIS — R402434 Glasgow coma scale score 3-8, 24 hours or more after hospital admission: Secondary | ICD-10-CM | POA: Diagnosis not present

## 2016-01-05 DIAGNOSIS — Z79899 Other long term (current) drug therapy: Secondary | ICD-10-CM | POA: Diagnosis not present

## 2016-01-05 DIAGNOSIS — Z515 Encounter for palliative care: Secondary | ICD-10-CM | POA: Diagnosis not present

## 2016-01-05 DIAGNOSIS — N179 Acute kidney failure, unspecified: Secondary | ICD-10-CM | POA: Diagnosis not present

## 2016-01-05 DIAGNOSIS — R29810 Facial weakness: Secondary | ICD-10-CM | POA: Diagnosis not present

## 2016-01-05 DIAGNOSIS — R531 Weakness: Secondary | ICD-10-CM | POA: Diagnosis not present

## 2016-01-05 DIAGNOSIS — D638 Anemia in other chronic diseases classified elsewhere: Secondary | ICD-10-CM | POA: Diagnosis present

## 2016-01-05 DIAGNOSIS — J9601 Acute respiratory failure with hypoxia: Secondary | ICD-10-CM | POA: Diagnosis not present

## 2016-01-05 DIAGNOSIS — E7800 Pure hypercholesterolemia, unspecified: Secondary | ICD-10-CM | POA: Diagnosis present

## 2016-01-05 DIAGNOSIS — Z7982 Long term (current) use of aspirin: Secondary | ICD-10-CM

## 2016-01-05 DIAGNOSIS — I70322 Atherosclerosis of unspecified type of bypass graft(s) of the extremities with rest pain, left leg: Secondary | ICD-10-CM | POA: Diagnosis present

## 2016-01-05 DIAGNOSIS — I10 Essential (primary) hypertension: Secondary | ICD-10-CM | POA: Diagnosis present

## 2016-01-05 DIAGNOSIS — Z66 Do not resuscitate: Secondary | ICD-10-CM | POA: Diagnosis not present

## 2016-01-05 DIAGNOSIS — I70222 Atherosclerosis of native arteries of extremities with rest pain, left leg: Secondary | ICD-10-CM | POA: Diagnosis present

## 2016-01-05 DIAGNOSIS — R4701 Aphasia: Secondary | ICD-10-CM | POA: Diagnosis not present

## 2016-01-05 DIAGNOSIS — R4189 Other symptoms and signs involving cognitive functions and awareness: Secondary | ICD-10-CM

## 2016-01-05 DIAGNOSIS — I743 Embolism and thrombosis of arteries of the lower extremities: Secondary | ICD-10-CM | POA: Diagnosis present

## 2016-01-05 DIAGNOSIS — I998 Other disorder of circulatory system: Secondary | ICD-10-CM | POA: Diagnosis present

## 2016-01-05 DIAGNOSIS — I619 Nontraumatic intracerebral hemorrhage, unspecified: Secondary | ICD-10-CM | POA: Diagnosis not present

## 2016-01-05 DIAGNOSIS — Z7901 Long term (current) use of anticoagulants: Secondary | ICD-10-CM

## 2016-01-05 DIAGNOSIS — I639 Cerebral infarction, unspecified: Secondary | ICD-10-CM | POA: Diagnosis not present

## 2016-01-05 DIAGNOSIS — R404 Transient alteration of awareness: Secondary | ICD-10-CM

## 2016-01-05 HISTORY — PX: PERIPHERAL VASCULAR CATHETERIZATION: SHX172C

## 2016-01-05 LAB — HEPARIN LEVEL (UNFRACTIONATED)
HEPARIN UNFRACTIONATED: 1.23 [IU]/mL — AB (ref 0.30–0.70)
Heparin Unfractionated: <0.1 IU/mL — ABNORMAL LOW (ref 0.30–0.70)

## 2016-01-05 LAB — FIBRINOGEN
Fibrinogen: 394 mg/dL (ref 210–475)
Fibrinogen: 394 mg/dL (ref 210–475)

## 2016-01-05 LAB — CBC
HEMATOCRIT: 28.3 % — AB (ref 40.0–52.0)
HEMATOCRIT: 32.7 % — AB (ref 40.0–52.0)
HEMOGLOBIN: 9.8 g/dL — AB (ref 13.0–18.0)
Hemoglobin: 10.8 g/dL — ABNORMAL LOW (ref 13.0–18.0)
MCH: 30.9 pg (ref 26.0–34.0)
MCH: 30.2 pg (ref 26.0–34.0)
MCHC: 34.6 g/dL (ref 32.0–36.0)
MCHC: 33.2 g/dL (ref 32.0–36.0)
MCV: 89.3 fL (ref 80.0–100.0)
MCV: 91.1 fL (ref 80.0–100.0)
PLATELETS: 145 10*3/uL — AB (ref 150–440)
Platelets: 152 10*3/uL (ref 150–440)
RBC: 3.17 MIL/uL — ABNORMAL LOW (ref 4.40–5.90)
RBC: 3.59 MIL/uL — ABNORMAL LOW (ref 4.40–5.90)
RDW: 16.9 % — ABNORMAL HIGH (ref 11.5–14.5)
RDW: 16.8 % — AB (ref 11.5–14.5)
WBC: 8.3 10*3/uL (ref 3.8–10.6)
WBC: 7.4 10*3/uL (ref 3.8–10.6)

## 2016-01-05 LAB — GLUCOSE, CAPILLARY: Glucose-Capillary: 136 mg/dL — ABNORMAL HIGH (ref 65–99)

## 2016-01-05 SURGERY — LOWER EXTREMITY ANGIOGRAPHY
Anesthesia: Moderate Sedation | Laterality: Left

## 2016-01-05 MED ORDER — MIDAZOLAM HCL 2 MG/2ML IJ SOLN
INTRAMUSCULAR | Status: DC | PRN
  Administered 2016-01-05 (×2): 2 mg via INTRAVENOUS

## 2016-01-05 MED ORDER — HYDROMORPHONE HCL 1 MG/ML IJ SOLN
INTRAMUSCULAR | Status: AC
  Administered 2016-01-05: 1 mg via INTRAVENOUS
  Filled 2016-01-05: qty 1

## 2016-01-05 MED ORDER — ONDANSETRON HCL 4 MG/2ML IJ SOLN: 4.0000 mg | Freq: Four times a day (QID) | INTRAMUSCULAR | Status: DC | PRN

## 2016-01-05 MED ORDER — LABETALOL HCL 5 MG/ML IV SOLN
INTRAVENOUS | Status: AC
  Filled 2016-01-05: qty 4

## 2016-01-05 MED ORDER — LABETALOL HCL 5 MG/ML IV SOLN
10.0000 mg | INTRAVENOUS | Status: DC | PRN
  Administered 2016-01-05 (×3): 10 mg via INTRAVENOUS
  Filled 2016-01-05 (×2): qty 4

## 2016-01-05 MED ORDER — HYDRALAZINE HCL 20 MG/ML IJ SOLN
5.0000 mg | INTRAMUSCULAR | Status: AC | PRN
  Administered 2016-01-05 (×2): 5 mg via INTRAVENOUS
  Filled 2016-01-05: qty 1

## 2016-01-05 MED ORDER — ATORVASTATIN CALCIUM 20 MG PO TABS: 80.0000 mg | ORAL_TABLET | Freq: Every day | ORAL | Status: DC

## 2016-01-05 MED ORDER — MORPHINE SULFATE (PF) 4 MG/ML IV SOLN
5.0000 mg | INTRAVENOUS | Status: DC | PRN
  Administered 2016-01-05: 4 mg via INTRAVENOUS
  Administered 2016-01-05: 2 mg via INTRAVENOUS
  Administered 2016-01-05: 3 mg via INTRAVENOUS
  Filled 2016-01-05: qty 1

## 2016-01-05 MED ORDER — OXYCODONE-ACETAMINOPHEN 7.5-325 MG PO TABS: 1.0000 | ORAL_TABLET | ORAL | Status: DC | PRN

## 2016-01-05 MED ORDER — FENTANYL CITRATE (PF) 100 MCG/2ML IJ SOLN
INTRAMUSCULAR | Status: AC
  Filled 2016-01-05: qty 2

## 2016-01-05 MED ORDER — SODIUM CHLORIDE 0.9 % IV SOLN: INTRAVENOUS | Status: DC

## 2016-01-05 MED ORDER — FENTANYL CITRATE (PF) 100 MCG/2ML IJ SOLN
INTRAMUSCULAR | Status: DC | PRN
  Administered 2016-01-05 (×2): 50 ug via INTRAVENOUS

## 2016-01-05 MED ORDER — SODIUM CHLORIDE 0.9 % IV SOLN
1.0000 mg/h | INTRAVENOUS | Status: DC
  Administered 2016-01-05: 1 mg/h
  Filled 2016-01-05 (×5): qty 10

## 2016-01-05 MED ORDER — HEPARIN SODIUM (PORCINE) 1000 UNIT/ML IJ SOLN
INTRAMUSCULAR | Status: DC | PRN
  Administered 2016-01-05: 4000 [IU] via INTRAVENOUS

## 2016-01-05 MED ORDER — SODIUM CHLORIDE 0.9% FLUSH: 10.0000 mL | INTRAVENOUS | Status: DC | PRN

## 2016-01-05 MED ORDER — LISINOPRIL-HYDROCHLOROTHIAZIDE 20-12.5 MG PO TABS: 1.0000 | ORAL_TABLET | Freq: Every day | ORAL | Status: DC

## 2016-01-05 MED ORDER — LISINOPRIL 20 MG PO TABS: 20.0000 mg | ORAL_TABLET | Freq: Every day | ORAL | Status: DC

## 2016-01-05 MED ORDER — METHYLPREDNISOLONE SODIUM SUCC 125 MG IJ SOLR: 125.0000 mg | INTRAMUSCULAR | Status: DC | PRN

## 2016-01-05 MED ORDER — IOPAMIDOL (ISOVUE-300) INJECTION 61%
INTRAVENOUS | Status: DC | PRN
  Administered 2016-01-05: 50 mL via INTRAVENOUS

## 2016-01-05 MED ORDER — SODIUM CHLORIDE 0.9 % IV SOLN: 250.0000 mL | INTRAVENOUS | Status: DC | PRN

## 2016-01-05 MED ORDER — HEPARIN (PORCINE) IN NACL 100-0.45 UNIT/ML-% IJ SOLN
600.0000 [IU]/h | INTRAMUSCULAR | Status: DC
  Administered 2016-01-05: 600 [IU]/h via INTRAVENOUS
  Filled 2016-01-05: qty 250

## 2016-01-05 MED ORDER — HYDROCHLOROTHIAZIDE 12.5 MG PO CAPS: 12.5000 mg | ORAL_CAPSULE | Freq: Every day | ORAL | Status: DC

## 2016-01-05 MED ORDER — FAMOTIDINE 20 MG PO TABS: 40.0000 mg | ORAL_TABLET | ORAL | Status: DC | PRN

## 2016-01-05 MED ORDER — HYDRALAZINE HCL 20 MG/ML IJ SOLN
INTRAMUSCULAR | Status: AC
  Filled 2016-01-05: qty 1

## 2016-01-05 MED ORDER — LABETALOL HCL 5 MG/ML IV SOLN
INTRAVENOUS | Status: DC | PRN
  Administered 2016-01-05: 10 mg via INTRAVENOUS

## 2016-01-05 MED ORDER — DEXTROSE 5 % IV SOLN: 1.5000 g | INTRAVENOUS | Status: DC

## 2016-01-05 MED ORDER — ASPIRIN EC 81 MG PO TBEC: 81.0000 mg | DELAYED_RELEASE_TABLET | Freq: Every day | ORAL | Status: DC

## 2016-01-05 MED ORDER — HEPARIN (PORCINE) IN NACL 100-0.45 UNIT/ML-% IJ SOLN
INTRAMUSCULAR | Status: AC
  Filled 2016-01-05: qty 250

## 2016-01-05 MED ORDER — HEPARIN (PORCINE) IN NACL 100-0.45 UNIT/ML-% IJ SOLN
300.0000 [IU]/h | INTRAMUSCULAR | Status: DC
  Administered 2016-01-05: 300 [IU]/h via INTRAVENOUS
  Filled 2016-01-05: qty 250

## 2016-01-05 MED ORDER — ALTEPLASE 2 MG IJ SOLR
INTRAMUSCULAR | Status: AC
  Filled 2016-01-05: qty 12

## 2016-01-05 MED ORDER — HYDROMORPHONE HCL 1 MG/ML IJ SOLN
1.0000 mg | Freq: Once | INTRAMUSCULAR | Status: AC
  Administered 2016-01-05: 1 mg via INTRAVENOUS

## 2016-01-05 MED ORDER — HEPARIN SODIUM (PORCINE) 1000 UNIT/ML IJ SOLN
INTRAMUSCULAR | Status: AC
  Filled 2016-01-05: qty 1

## 2016-01-05 MED ORDER — MORPHINE SULFATE (PF) 2 MG/ML IV SOLN
2.0000 mg | INTRAVENOUS | Status: DC | PRN
  Administered 2016-01-06: 2 mg via INTRAVENOUS
  Filled 2016-01-05: qty 2

## 2016-01-05 MED ORDER — LIDOCAINE-EPINEPHRINE (PF) 1 %-1:200000 IJ SOLN
INTRAMUSCULAR | Status: AC
Start: 2016-01-05 — End: 2016-01-05
  Filled 2016-01-05: qty 30

## 2016-01-05 MED ORDER — MIDAZOLAM HCL 5 MG/5ML IJ SOLN
INTRAMUSCULAR | Status: AC
  Filled 2016-01-05: qty 5

## 2016-01-05 MED ORDER — SODIUM CHLORIDE 0.9 % IV SOLN
0.5000 mg/h | INTRAVENOUS | Status: DC
  Administered 2016-01-05: 0.5 mg/h
  Filled 2016-01-05 (×3): qty 10

## 2016-01-05 MED ORDER — METOPROLOL TARTRATE 5 MG/5ML IV SOLN
2.0000 mg | INTRAVENOUS | Status: DC | PRN
  Filled 2016-01-05: qty 5

## 2016-01-05 MED ORDER — SODIUM CHLORIDE 0.9% FLUSH
10.0000 mL | Freq: Two times a day (BID) | INTRAVENOUS | Status: DC
  Administered 2016-01-05: 10 mL
  Administered 2016-01-06: 40 mL

## 2016-01-05 MED ORDER — DEXMEDETOMIDINE HCL IN NACL 400 MCG/100ML IV SOLN
0.4000 ug/kg/h | INTRAVENOUS | Status: DC
  Administered 2016-01-05: 0.4 ug/kg/h via INTRAVENOUS
  Filled 2016-01-05: qty 100

## 2016-01-05 MED ORDER — MORPHINE SULFATE (PF) 2 MG/ML IV SOLN
INTRAVENOUS | Status: AC
  Filled 2016-01-05: qty 2

## 2016-01-05 MED ORDER — ALTEPLASE 2 MG IJ SOLR
INTRAMUSCULAR | Status: DC | PRN
  Administered 2016-01-05: 8 mg

## 2016-01-05 MED ORDER — HEPARIN (PORCINE) IN NACL 2-0.9 UNIT/ML-% IJ SOLN
INTRAMUSCULAR | Status: AC
  Filled 2016-01-05: qty 1000

## 2016-01-05 MED ORDER — SODIUM CHLORIDE 0.9% FLUSH
3.0000 mL | Freq: Two times a day (BID) | INTRAVENOUS | Status: DC
  Administered 2016-01-05 – 2016-01-06 (×2): 3 mL via INTRAVENOUS

## 2016-01-05 MED ORDER — SODIUM CHLORIDE 0.9% FLUSH: 3.0000 mL | INTRAVENOUS | Status: DC | PRN

## 2016-01-05 MED ORDER — MORPHINE SULFATE (PF) 2 MG/ML IV SOLN
INTRAVENOUS | Status: AC
  Filled 2016-01-05: qty 1

## 2016-01-05 MED ORDER — MIDAZOLAM HCL 2 MG/2ML IJ SOLN
1.0000 mg | INTRAMUSCULAR | Status: DC | PRN
Start: 2016-01-05 — End: 2016-01-06

## 2016-01-05 MED ORDER — HYDROMORPHONE HCL 1 MG/ML IJ SOLN: 1.0000 mg | Freq: Once | INTRAMUSCULAR | Status: AC

## 2016-01-05 MED ORDER — CHLORHEXIDINE GLUCONATE CLOTH 2 % EX PADS
6.0000 | MEDICATED_PAD | Freq: Once | CUTANEOUS | Status: DC
Start: 2016-01-05 — End: 2016-01-05

## 2016-01-05 SURGICAL SUPPLY — 19 items
BALLN LUTONIX 5X150X130 (BALLOONS) ×3
BALLN LUTONIX 6X150X130 (BALLOONS) ×3
BALLN ULTRVRSE 3X300X130 (BALLOONS) ×3
BALLOON LUTONIX 5X150X130 (BALLOONS) ×1 IMPLANT
BALLOON LUTONIX 6X150X130 (BALLOONS) ×1 IMPLANT
BALLOON ULTRVRSE 3X300X130 (BALLOONS) ×1 IMPLANT
CATH INFUS 135CMX50CM (CATHETERS) ×3 IMPLANT
CATH PIG 70CM (CATHETERS) ×3 IMPLANT
CATH VERT 100CM (CATHETERS) ×3 IMPLANT
DEVICE PRESTO INFLATION (MISCELLANEOUS) ×3 IMPLANT
DEVICE SOLENT OMNI 120CM (CATHETERS) ×3 IMPLANT
GLIDEWIRE ADV .035X260CM (WIRE) ×3 IMPLANT
PACK ANGIOGRAPHY (CUSTOM PROCEDURE TRAY) ×3 IMPLANT
SHEATH ANL2 6FRX45 HC (SHEATH) ×3 IMPLANT
SHEATH BRITE TIP 5FRX11 (SHEATH) ×3 IMPLANT
SHIELD RADPAD SCOOP 12X17 (MISCELLANEOUS) ×3 IMPLANT
SYR MEDRAD MARK V 150ML (SYRINGE) ×3 IMPLANT
TUBING CONTRAST HIGH PRESS 72 (TUBING) ×3 IMPLANT
WIRE J 3MM .035X145CM (WIRE) ×3 IMPLANT

## 2016-01-05 NOTE — Op Note (Signed)
[facility] Percutaneous Study/Intervention Procedural Note   Date of Surgery: 12/12/2015  Surgeon(s):Alyssia Heese   Assistants:none  Pre-operative Diagnosis: PAD with rest pain left lower extremity, acute on chronic ischemia  Post-operative diagnosis: Same  Procedure(s) Performed: 1. Ultrasound guidance for vascular access right femoral artery 2. Catheter placement into left anterior tibial artery from right femoral approach 3. Aortogram and selective left lower extremity angiogram 4. Catheter directed thrombolytic therapy with 8 mg of TPA instilled with the AngioJet Omni catheter and a power pulse spray fashion to the left common femoral artery, SFA, popliteal artery, and anterior tibial arteries 5. Mechanical rheolytic thrombectomy with the AngioJet on the catheter to the left common femoral artery, SFA, popliteal artery, and anterior tibial artery  6.  Percutaneous transluminal angioplasty of the left anterior tibial artery 3 mm diameter angioplasty balloon 7. Percutaneous transluminal angioplasty of the left popliteal artery, SFA, and common femoral artery using 5 mm diameter angioplasty balloon in the mid and distal segments and a 6 mm diameter angioplasty balloon in the common femoral artery and proximal SFA including drug coated balloons proximally and distally  EBL: 10 cc  Contrast: 50 cc  Fluoro Time: 9.9 minutes  Moderate Conscious Sedation Time: approximately 50 minutes using 4 mg of Versed and 100 mcg of Fentanyl  Indications: Patient is a 82 y.o.male with rest pain of the left leg. He has acute on chronic worsening of his symptoms after multiple previous interventions.. The patient has noninvasive study showing flat waveforms on the left with essentially no flow distally. The patient is brought in for angiography for further evaluation and potential  treatment. Risks and benefits are discussed and informed consent is obtained  Procedure: The patient was identified and appropriate procedural time out was performed. The patient was then placed supine on the table and prepped and draped in the usual sterile fashion.Moderate conscious sedation was administered during a face to face encounter with the patient throughout the procedure with my supervision of the RN administering medicines and monitoring the patient's vital signs, pulse oximetry, telemetry and mental status throughout from the start of the procedure until the patient was taken to the recovery room. Ultrasound was used to evaluate the right common femoral artery. It was patent . A digital ultrasound image was acquired. A Seldinger needle was used to access the right common femoral artery under direct ultrasound guidance and a permanent image was performed. A 0.035 J wire was advanced without resistance and a 5Fr sheath was placed. Pigtail catheter was placed into the aorta and an AP aortogram was performed. This demonstrated normal renal arteries and normal aorta and iliac segments without significant stenosis. I then crossed the aortic bifurcation and advanced to the left femoral head. Selective left lower extremity angiogram was then performed. This demonstrated thrombus in the common femoral artery causing high-grade stenosis of the profunda femoris artery and occlusion of the SFA and previous bypass. The only distal reconstitution was an anterior tibial artery in the mid to proximal segment with sluggish flow distally. The patient was systemically heparinized and a 6 [place] Ansell sheath was then placed over the [employer] wire. I then used a Kumpe catheter and the advantage wire to navigate into the SFA occlusion and easily cross the occlusion and confirm intraluminal flow distally in the anterior tibial artery. The advantage wire was replaced. I instilled 8 mg of TPA and a power  pulse spray fashion from the common femoral artery down through the SFA and popliteal  arteries and into the mid anterior tibial artery. This was allowed to dwell for several minutes and then mechanical rheolytic thrombectomy was performed on the same vessels with the AngioJet Omni catheter. Approximately 200 cc of effluent returned from mechanical thrombectomy. Thrombosis and occlusion persisted. Angioplasty was then performed in the anterior tibial artery using a 3 mm diameter by 30 cm length angioplasty balloon inflated to 12 atm for 1 minute. A 5 mm diameter by 15 cm length Lutonix drug-coated angioplasty balloon was inflated in the popliteal artery to 8 atm for 1 minute. The same balloon was then pulled back and used to treat the distal and mid SFA with inflations to 12 atm for 1 minute. A 6 mm diameter by 15 cm length Lutonix drug-coated angioplasty balloon was used to treat the common femoral artery and proximal superficial femoral artery. This was inflated to 10 atm for 1 minute. Angiogram following the above procedures demonstrated residual flow-limiting thrombus and stenosis in the common femoral artery and proximal SFA with some residual thrombus in the mid and distal SFA and popliteal artery as well as at the origin of the anterior tibial artery as well. I felt continued infusion of TPA would be his best bet to help clear up this thrombosis. A 130 cm total length 50 cm working length lytic catheter was then placed parked proximally in the common femoral artery and distally in the below-the-knee popliteal artery. It was secured into place as well as the sheath with a Prolene suture. A central line was placed which will be dictated separately. The patient was taken to the recovery room in stable condition having tolerated the procedure well.  Findings:  Aortogram: This demonstrated normal renal arteries and normal aorta and iliac segments without significant stenosis. Left  Lower Extremity: This demonstrated thrombus in the common femoral artery causing high-grade stenosis of the profunda femoris artery and occlusion of the SFA and previous bypass. The only distal reconstitution was an anterior tibial artery in the mid to proximal segment with sluggish flow distally.   Disposition: Patient was taken to the recovery room in stable condition having tolerated the procedure well.  Complications: None  Devin Cooley 12/25/2015 12:39 PM

## 2016-01-05 NOTE — Progress Notes (Signed)
ANTICOAGULATION CONSULT NOTE - Initial Consult  Pharmacy Consult for Heparin Indication: Ischemic leg s/p vascular procedure  No Known Allergies  Patient Measurements: Height: 5\' 9"  (175.3 cm) Weight: 181 lb 14.1 oz (82.5 kg) IBW/kg (Calculated) : 70.7   Vital Signs: Temp: 97.7 F (36.5 C) (09/28 2000) Temp Source: Oral (09/28 2000) BP: 146/70 (09/28 2000) Pulse Rate: 72 (09/28 2000)  Labs:  Recent Labs  01/03/16 1629 12/15/2015 1451 12/30/2015 1917  HGB  --  9.8* 10.8*  HCT  --  28.3* 32.7*  PLT  --  152 145*  HEPARINUNFRC  --   --  1.23*  CREATININE 1.69*  --   --     Estimated Creatinine Clearance: 32.5 mL/min (by C-G formula based on SCr of 1.69 mg/dL (H)).   Medical History: Past Medical History:  Diagnosis Date  . H/O: GI bleed    unknown date  . Hyperlipidemia   . Hypertension   . PAD (peripheral artery disease) (HCC)    unknown date    Medications:  Scheduled:  . [START ON 12/11/2015] aspirin EC  81 mg Oral Daily  . [START ON 01/01/2016] atorvastatin  80 mg Oral Daily  . lisinopril  20 mg Oral Daily   And  . hydrochlorothiazide  12.5 mg Oral Daily  . sodium chloride flush  10-40 mL Intracatheter Q12H  . sodium chloride flush  3 mL Intravenous Q12H   Infusions:  . alteplase (LIMB ISCHEMIA) 10 mg in normal saline (0.02 mg/mL) infusion 1 mg/hr (01/03/2016 1500)  . alteplase (LIMB ISCHEMIA) 10 mg in normal saline (0.02 mg/mL) infusion 0.5 mg/hr (12/24/2015 1816)  . dexmedetomidine 0.4 mcg/kg/hr (12/18/2015 1726)  . heparin      Assessment: 82 y/o M s/p revascularization on heparin drip at 600 units/hr with goal of maintaining HL 0.2-0.5.  Goal of Therapy:  Heparin level 0.2-0.5 units/ml Monitor platelets by anticoagulation protocol: Yes   Plan:  Patient admitted to ICU from vascular lab with heparin drip running at 600 units/hr. CBC and HL ordered q 6 h x 4. Will f/u HL and adjust accordingly.   9/28:  HL @ 19:00 = 1.23,  Spoke with lab, lab tech  stated that HL was drawn from a central line so unlikely this result is due to incorrect sampling.  Will hold heparin gtt for 1 hr and restart on 9/29 @ 22:45.   Will recheck HL on 9/29 @ 0500.   Salvatore Poe D 12/30/2015,9:40 PM

## 2016-01-05 NOTE — Progress Notes (Signed)
Pt continues to complain of 10/10 pain in Left Hip and leg. Morphine given. Will continue to assess.

## 2016-01-05 NOTE — Progress Notes (Signed)
Pt pain controlled with Precedex drip, and morphine. Pt remains A&O at this time and is easily arouseable. Pt is in stable condition at this time. Full assessment in EPIC.

## 2016-01-05 NOTE — Plan of Care (Signed)
Problem: Education: Goal: Knowledge of [facility] information/materials will improve Outcome: Progressing Pt and family provided welcome booklet and oriented to the unit.  Problem: Safety: Goal: Ability to remain free from injury will improve Outcome: Progressing Pt remained pain free on my shift.

## 2016-01-05 NOTE — H&P (Signed)
[facility] History & Physical Update  The patient was interviewed and re-examined.  The patient's previous History and Physical has been reviewed and is unchanged.  There is no change in the plan of care. We plan to proceed with the scheduled procedure.  DEW,JASON, MD  01/04/2016, 9:59 AM

## 2016-01-05 NOTE — Op Note (Signed)
[facility]   PROCEDURE NOTE  PROCEDURE: 1. Right femoral venous triple lumen central venous catheter placement 2. Right femoral vein cannulation under ultrasound guidance  PRE-OPERATIVE DIAGNOSIS: Ischemic left lower extremity with rest pain receiving continuous thrombolytic therapy and need for durable venous access  POST-OPERATIVE DIAGNOSIS: same as above  SURGEON: Vicci Reder, MD  ANESTHESIA:  None  ESTIMATED BLOOD LOSS: minimal  FINDING(S): none  SPECIMEN(S):  none  INDICATIONS:   Gerlene FeeJuan Drollinger is a 82 y.o. male who presents with need for venous access.  The patient presents for central venous catheter placement.  The patient is aware the risks of central venous catheter placement include but are not limited to: bleeding, infection, central venous injury, pneumothorax, possible venous stenosis, possible malpositioning in the venous system, and possible infections related to long-term catheter presence. The patient was aware of these risks and agreed to proceed.  DESCRIPTION: After written informed consent was obtained from the patient and/or family, the patient was placed supine in the hospital bed.  The patient was prepped with chloraprep and draped in the standard fashion. The femoral location was selected as we artery had the area prepped and draped and prepared for the arteriogram.  I anesthesized the groin cannulation site with 1% lidocaine then under ultrasound guidance, the right femoral  vein was cannulated with the 18 gauge needle and a permanent image was recorded.  A J wire was then placed down in the superior vena cava.  After a skin nick and dilatation, the triple lumen central venous catheter was placed over the wire and the wire was removed.  Each port was aspirated and flushed with sterile normal saline.  The catheter was secured in placed with three interrupted stitches of 3-0 Silk tied to the catheter.  The catheter was dressed with sterile  dressing.    COMPLICATIONS: none apparent  CONDITION: stable  Ferris Fielden 01-26-2016, 12:51 PM

## 2016-01-05 NOTE — Progress Notes (Signed)
ANTICOAGULATION CONSULT NOTE - Initial Consult  Pharmacy Consult for Heparin Indication: Ischemic leg s/p vascular procedure  No Known Allergies  Patient Measurements: Height: 5\' 10"  (177.8 cm) Weight: 172 lb (78 kg) IBW/kg (Calculated) : 73   Vital Signs: BP: 189/96 (09/28 1408) Pulse Rate: 75 (09/28 1354)  Labs:  Recent Labs  01/03/16 1629  CREATININE 1.69*    Estimated Creatinine Clearance: 33.6 mL/min (by C-G formula based on SCr of 1.69 mg/dL (H)).   Medical History: Past Medical History:  Diagnosis Date  . H/O: GI bleed    unknown date  . Hyperlipidemia   . Hypertension   . PAD (peripheral artery disease) (HCC)    unknown date    Medications:  Scheduled:  . [START ON 01/04/2016] aspirin EC  81 mg Oral Daily  . [START ON 01/01/2016] atorvastatin  80 mg Oral Daily  . heparin      . lisinopril  20 mg Oral Daily   And  . hydrochlorothiazide  12.5 mg Oral Daily  .  HYDROmorphone (DILAUDID) injection  1 mg Intravenous Once  . labetalol      . morphine      . morphine      . sodium chloride flush  3 mL Intravenous Q12H   Infusions:  . alteplase (LIMB ISCHEMIA) 10 mg in normal saline (0.02 mg/mL) infusion 1 mg/hr (12/29/2015 1323)  . alteplase (LIMB ISCHEMIA) 10 mg in normal saline (0.02 mg/mL) infusion    . dexmedetomidine    . heparin 600 Units/hr (12/21/2015 1248)    Assessment: 82 y/o M s/p revascularization on heparin drip at 600 units/hr with goal of maintaining HL 0.2-0.5.  Goal of Therapy:  Heparin level 0.2-0.5 units/ml Monitor platelets by anticoagulation protocol: Yes   Plan:  Patient admitted to ICU from vascular lab with heparin drip running at 600 units/hr. CBC and HL ordered q 6 h x 4. Will f/u HL and adjust accordingly.   Luisa HartChristy, Darsh Vandevoort D 12/16/2015,3:05 PM

## 2016-01-05 NOTE — Progress Notes (Addendum)
Spoke to Dr Wyn Quakerew x 2 regarding pain control. Patient report interventions were not helpful, patient lethargic after dilaudid administration, uncooperative with keeping legs straight. MD aware that am unable to assess pedal pulses in left foot. Per Dew, start on precedex drip in ICU to assist with patient relaxation

## 2016-01-06 ENCOUNTER — Ambulatory Visit
Admission: RE | Admit: 2016-01-06 | Payer: Commercial Managed Care - HMO | Source: Ambulatory Visit | Admitting: Vascular Surgery

## 2016-01-06 ENCOUNTER — Encounter: Admission: AD | Disposition: E | Payer: Self-pay | Source: Ambulatory Visit | Attending: Vascular Surgery

## 2016-01-06 ENCOUNTER — Encounter: Payer: Self-pay | Admitting: Vascular Surgery

## 2016-01-06 ENCOUNTER — Inpatient Hospital Stay: Payer: Commercial Managed Care - HMO

## 2016-01-06 DIAGNOSIS — R404 Transient alteration of awareness: Secondary | ICD-10-CM

## 2016-01-06 DIAGNOSIS — I619 Nontraumatic intracerebral hemorrhage, unspecified: Secondary | ICD-10-CM

## 2016-01-06 LAB — CBC
HEMATOCRIT: 29.6 % — AB (ref 40.0–52.0)
HEMATOCRIT: 27.8 % — AB (ref 40.0–52.0)
HEMOGLOBIN: 10.1 g/dL — AB (ref 13.0–18.0)
HEMOGLOBIN: 9.5 g/dL — AB (ref 13.0–18.0)
MCH: 30.4 pg (ref 26.0–34.0)
MCH: 30.6 pg (ref 26.0–34.0)
MCHC: 34.2 g/dL (ref 32.0–36.0)
MCHC: 34.1 g/dL (ref 32.0–36.0)
MCV: 89 fL (ref 80.0–100.0)
MCV: 89.7 fL (ref 80.0–100.0)
Platelets: 116 10*3/uL — ABNORMAL LOW (ref 150–440)
Platelets: 114 10*3/uL — ABNORMAL LOW (ref 150–440)
RBC: 3.32 MIL/uL — AB (ref 4.40–5.90)
RBC: 3.1 MIL/uL — ABNORMAL LOW (ref 4.40–5.90)
RDW: 16.8 % — ABNORMAL HIGH (ref 11.5–14.5)
RDW: 16.8 % — AB (ref 11.5–14.5)
WBC: 7 10*3/uL (ref 3.8–10.6)
WBC: 8.8 10*3/uL (ref 3.8–10.6)

## 2016-01-06 LAB — BASIC METABOLIC PANEL
ANION GAP: 10 (ref 5–15)
BUN: 29 mg/dL — ABNORMAL HIGH (ref 6–20)
CHLORIDE: 105 mmol/L (ref 101–111)
CO2: 19 mmol/L — AB (ref 22–32)
CREATININE: 1.37 mg/dL — AB (ref 0.61–1.24)
Calcium: 9.3 mg/dL (ref 8.9–10.3)
GFR calc non Af Amer: 46 mL/min — ABNORMAL LOW (ref >60)
GFR, EST AFRICAN AMERICAN: 53 mL/min — AB (ref >60)
Glucose, Bld: 199 mg/dL — ABNORMAL HIGH (ref 65–99)
POTASSIUM: 4.3 mmol/L (ref 3.5–5.1)
SODIUM: 134 mmol/L — AB (ref 135–145)

## 2016-01-06 LAB — PROTIME-INR
INR: 1.37
INR: 1.4
INR: 1.17
Prothrombin Time: 17 seconds — ABNORMAL HIGH (ref 11.4–15.2)
Prothrombin Time: 17.3 seconds — ABNORMAL HIGH (ref 11.4–15.2)
Prothrombin Time: 15 seconds (ref 11.4–15.2)

## 2016-01-06 LAB — FIBRINOGEN
FIBRINOGEN: 265 mg/dL (ref 210–475)
FIBRINOGEN: 243 mg/dL (ref 210–475)

## 2016-01-06 LAB — HEPARIN LEVEL (UNFRACTIONATED)
HEPARIN UNFRACTIONATED: 0.8 [IU]/mL — AB (ref 0.30–0.70)
HEPARIN UNFRACTIONATED: 0.75 [IU]/mL — AB (ref 0.30–0.70)
HEPARIN UNFRACTIONATED: 0.8 [IU]/mL — AB (ref 0.30–0.70)

## 2016-01-06 SURGERY — LOWER EXTREMITY ANGIOGRAPHY
Anesthesia: Moderate Sedation | Laterality: Left

## 2016-01-06 MED ORDER — HALOPERIDOL LACTATE 5 MG/ML IJ SOLN: 0.5000 mg | INTRAMUSCULAR | Status: DC | PRN

## 2016-01-06 MED ORDER — METOPROLOL TARTRATE 5 MG/5ML IV SOLN
INTRAVENOUS | Status: AC
  Administered 2016-01-06: 10 mg via INTRAVENOUS
  Filled 2016-01-06: qty 10

## 2016-01-06 MED ORDER — GLYCOPYRROLATE 0.2 MG/ML IJ SOLN: 0.2000 mg | INTRAMUSCULAR | Status: DC | PRN

## 2016-01-06 MED ORDER — ACETAMINOPHEN 325 MG PO TABS: 650.0000 mg | ORAL_TABLET | Freq: Four times a day (QID) | ORAL | Status: DC | PRN

## 2016-01-06 MED ORDER — PROTAMINE SULFATE 10 MG/ML IV SOLN
3.0000 mg | INTRAVENOUS | Status: AC
  Administered 2016-01-06: 3 mg via INTRAVENOUS
  Filled 2016-01-06: qty 0.3

## 2016-01-06 MED ORDER — HALOPERIDOL LACTATE 2 MG/ML PO CONC
0.5000 mg | ORAL | Status: DC | PRN
  Filled 2016-01-06: qty 0.3

## 2016-01-06 MED ORDER — ONDANSETRON HCL 4 MG/2ML IJ SOLN: 4.0000 mg | Freq: Four times a day (QID) | INTRAMUSCULAR | Status: DC | PRN

## 2016-01-06 MED ORDER — PROTHROMBIN COMPLEX CONC HUMAN 500 UNITS IV KIT
3000.0000 [IU] | PACK | Status: DC
  Filled 2016-01-06: qty 120

## 2016-01-06 MED ORDER — PROTAMINE SULFATE 10 MG/ML IV SOLN
6.0000 mg | INTRAVENOUS | Status: AC
  Administered 2016-01-06: 6 mg via INTRAVENOUS
  Filled 2016-01-06: qty 0.6

## 2016-01-06 MED ORDER — HYDRALAZINE HCL 20 MG/ML IJ SOLN
INTRAMUSCULAR | Status: AC
  Filled 2016-01-06: qty 1

## 2016-01-06 MED ORDER — FAMOTIDINE IN NACL 20-0.9 MG/50ML-% IV SOLN: 20.0000 mg | INTRAVENOUS | Status: DC

## 2016-01-06 MED ORDER — ACETAMINOPHEN 650 MG RE SUPP: 650.0000 mg | Freq: Four times a day (QID) | RECTAL | Status: DC | PRN

## 2016-01-06 MED ORDER — METOPROLOL TARTRATE 5 MG/5ML IV SOLN
10.0000 mg | Freq: Once | INTRAVENOUS | Status: AC
  Administered 2016-01-06: 10 mg via INTRAVENOUS

## 2016-01-06 MED ORDER — HYDROMORPHONE HCL 1 MG/ML IJ SOLN
0.5000 mg | INTRAMUSCULAR | Status: DC | PRN
  Administered 2016-01-06: 0.5 mg via INTRAVENOUS
  Administered 2016-01-06: 2 mg via INTRAVENOUS
  Administered 2016-01-06: 1 mg via INTRAVENOUS
  Filled 2016-01-06: qty 1
  Filled 2016-01-06: qty 2
  Filled 2016-01-06: qty 1

## 2016-01-06 MED ORDER — HALOPERIDOL 0.5 MG PO TABS
0.5000 mg | ORAL_TABLET | ORAL | Status: DC | PRN
  Filled 2016-01-06: qty 1

## 2016-01-06 MED ORDER — LORAZEPAM 2 MG/ML IJ SOLN
1.0000 mg | INTRAMUSCULAR | Status: DC | PRN
Start: 2016-01-06 — End: 2016-01-07

## 2016-01-06 MED ORDER — PROTHROMBIN COMPLEX CONC HUMAN 500 UNITS IV KIT
4000.0000 [IU] | PACK | Status: AC
  Administered 2016-01-06: 4000 [IU] via INTRAVENOUS
  Filled 2016-01-06: qty 160

## 2016-01-06 MED ORDER — HYDRALAZINE HCL 20 MG/ML IJ SOLN
10.0000 mg | Freq: Once | INTRAMUSCULAR | Status: AC
  Administered 2016-01-06: 10 mg via INTRAVENOUS

## 2016-01-06 MED ORDER — LORAZEPAM 1 MG PO TABS: 1.0000 mg | ORAL_TABLET | ORAL | Status: DC | PRN

## 2016-01-06 MED ORDER — POLYVINYL ALCOHOL 1.4 % OP SOLN
1.0000 [drp] | Freq: Four times a day (QID) | OPHTHALMIC | Status: DC | PRN
  Filled 2016-01-06: qty 15

## 2016-01-06 MED ORDER — BIOTENE DRY MOUTH MT LIQD: 15.0000 mL | OROMUCOSAL | Status: DC | PRN

## 2016-01-06 MED ORDER — LORAZEPAM 2 MG/ML PO CONC: 1.0000 mg | ORAL | Status: DC | PRN

## 2016-01-06 MED ORDER — GLYCOPYRROLATE 1 MG PO TABS: 1.0000 mg | ORAL_TABLET | ORAL | Status: DC | PRN

## 2016-01-06 MED ORDER — METOPROLOL TARTRATE 5 MG/5ML IV SOLN: 10.0000 mg | INTRAVENOUS | Status: DC

## 2016-01-06 MED ORDER — IPRATROPIUM-ALBUTEROL 0.5-2.5 (3) MG/3ML IN SOLN: 3.0000 mL | Freq: Four times a day (QID) | RESPIRATORY_TRACT | Status: DC | PRN

## 2016-01-06 MED ORDER — ONDANSETRON 4 MG PO TBDP
4.0000 mg | ORAL_TABLET | Freq: Four times a day (QID) | ORAL | Status: DC | PRN
  Filled 2016-01-06: qty 1

## 2016-01-08 NOTE — Consult Note (Signed)
[facility] [facility] Consultation     ASSESSMENT/PLAN   82 yo male admitted for vascular procedure, complicated by acute brain hemorrhage, likely hemorrhagic cva.   PULMONARY A: Acute hypoxic respiratory failure.  GCS=8, respiratory compromise imminent.  P:   --Will require intubation soon, will try to confirm code status with family beforehand if possible.   CARDIOVASCULAR A: Htn, likely secondary to CVA.  Peripheral vascular disease, s/p LLE angiogram with catheter directed thrombolytic therapy on 9/28.   P:  Continue hydralazine, to keep SBP< 200.   RENAL A:  AKI P:   Continue to monitor.   GASTROINTESTINAL A:  NPO P:   -GI prophylaxis.   HEMATOLOGIC A:  Anemia of chronic disease.  P:  Transfuse as needed.   INFECTIOUS A:   P:    Micro/culture results:  BCx2 -- UC -- Sputum--  Antibiotics:   ENDOCRINE A:   P:   Monitor blood sugars.   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  -Unresponsiveness.  P:   -Discussed findings with neurology, vascular surgery.  -Discussed with pharmacy, will ordered stat INR, K-centra, protamine due to recent heparin infusion.    MAJOR EVENTS/TEST RESULTS:   Best Practices  DVT Prophylaxis: held.  GI Prophylaxis: famotidine    ---------------------------------------  ---------------------------------------   Name: Devin Cooley MRN: [id number] DOB: 1929/06/30    ADMISSION DATE:  12/13/2015 CONSULTATION DATE:  November 08, 2015  REFERRING MD :  Dr. Wyn Quakerew  CHIEF COMPLAINT:  Unresponsiveness.    HISTORY OF PRESENT ILLNESS:    The patient is a 82 yo male with severe PVD, s/p revascularization, presented for elective LLE catheter directed thrombectomy.  This was uneventful, however it was noted that the patient developed left facial droop, code stroke called, patient taken for stat CT head which showed right hemorrhage with surrounding edema, possible left brain ischemic  CVA.    PAST MEDICAL HISTORY :  Past Medical History:  Diagnosis Date  . H/O: GI bleed    unknown date  . Hyperlipidemia   . Hypertension   . PAD (peripheral artery disease) (HCC)    unknown date   Past Surgical History:  Procedure Laterality Date  . blood clot    . HERNIA REPAIR     52 years  . PERIPHERAL VASCULAR CATHETERIZATION Left 10/26/2015   Procedure: Lower Extremity Angiography;  Surgeon: Annice NeedyJason S Dew, MD;  Location: [facility] INVASIVE CV LAB;  Service: Cardiovascular;  Laterality: Left;  . PERIPHERAL VASCULAR CATHETERIZATION Left 11/28/2015   Procedure: Lower Extremity Angiography;  Surgeon: Annice NeedyJason S Dew, MD;  Location: [facility] INVASIVE CV LAB;  Service: Cardiovascular;  Laterality: Left;  . PERIPHERAL VASCULAR CATHETERIZATION Left 01/03/2016   Procedure: Lower Extremity Angiography;  Surgeon: Annice NeedyJason S Dew, MD;  Location: [facility] INVASIVE CV LAB;  Service: Cardiovascular;  Laterality: Left;   Prior to Admission medications   Medication Sig Start Date End Date Taking? Authorizing Provider  aspirin 81 MG tablet Take 81 mg by mouth daily.   Yes Historical Provider, MD  atorvastatin (LIPITOR) 80 MG tablet Take 80 mg by mouth daily. Reported on 10/25/2015   Yes Historical Provider, MD  Cholecalciferol (VITAMIN D PO) Take 1 capsule by mouth daily.   Yes Historical Provider, MD  lisinopril-hydrochlorothiazide (PRINZIDE,ZESTORETIC) 20-12.5 MG per tablet Take 1 tablet by mouth daily.   Yes Historical Provider, MD  apixaban (ELIQUIS) 2.5 MG TABS tablet Take 1 tablet (2.5 mg total) by mouth 2 (two) times daily. 10/28/15   Barbara CowerJason  Driscilla Grammes, MD  HYDROcodone-acetaminophen (NORCO/VICODIN) 5-325 MG tablet Take 1-2 tablets by mouth daily. 10/28/15   Annice Needy, MD  oxyCODONE-acetaminophen (PERCOCET) 7.5-325 MG tablet Take 1 tablet by mouth every 4 (four) hours as needed for severe pain. 11/28/15   Annice Needy, MD   No Known Allergies  FAMILY HISTORY:  No family history on file. SOCIAL HISTORY:  reports that  he has quit smoking. He does not have any smokeless tobacco history on file. He reports that he does not drink alcohol or use drugs.  REVIEW OF SYSTEMS:   Could not provide ROS due to unresponsivness.    VITAL SIGNS: Temp:  [97.7 F (36.5 C)-97.9 F (36.6 C)] 97.9 F (36.6 C) (09/29 0400) Pulse Rate:  [44-137] 78 (09/29 0745) Resp:  [10-25] 24 (09/29 0745) BP: (134-219)/(59-122) 209/108 (09/29 0745) SpO2:  [93 %-100 %] 100 % (09/29 0745) Weight:  [172 lb (78 kg)-181 lb 14.1 oz (82.5 kg)] 181 lb 14.1 oz (82.5 kg) (09/28 1500) HEMODYNAMICS:   VENTILATOR SETTINGS:   INTAKE / OUTPUT:  Intake/Output Summary (Last 24 hours) at 01-21-2016 0755 Last data filed at 01-21-16 0500  Gross per 24 hour  Intake           1228.8 ml  Output              200 ml  Net           1028.8 ml    Physical Examination:   VS: BP (!) 209/108   Pulse 78   Temp 97.9 F (36.6 C) (Oral)   Resp (!) 24   Ht 5\' 9"  (1.753 m)   Wt 181 lb 14.1 oz (82.5 kg)   SpO2 100%   BMI 26.86 kg/m   General Appearance: No distress  Neuro:without focal findings, mental status, speech normal,. HEENT: PERRLA, EOM intact, no ptosis, no other lesions noticed;  Pulmonary: normal breath sounds., diaphragmatic excursion normal. CardiovascularNormal S1,S2.  No m/r/g.    Abdomen: Benign, Soft, non-tender, No masses, hepatosplenomegaly, No lymphadenopathy Renal:  No costovertebral tenderness  GU:  Not performed at this time. Endoc: No evident thyromegaly, no signs of acromegaly. Skin:   warm, no rashes, no ecchymosis  Extremities: normal, no cyanosis, clubbing, no edema, warm with normal capillary refill.    LABS: Reviewed   LABORATORY PANEL:   CBC  Recent Labs Lab 2016-01-21 0520  WBC 8.8  HGB 9.5*  HCT 27.8*  PLT 114*    Chemistries   Recent Labs Lab 01/03/16 1629  BUN 39*  CREATININE 1.69*     Recent Labs Lab 12/19/2015 1442  GLUCAP 136*   No results for input(s): PHART, PCO2ART, PO2ART in the  last 168 hours. No results for input(s): AST, ALT, ALKPHOS, BILITOT, ALBUMIN in the last 168 hours.  Cardiac Enzymes No results for input(s): TROPONINI in the last 168 hours.  RADIOLOGY:  Ct Head Code Stroke Wo Contrast`  Result Date: 2016/01/21 CLINICAL DATA:  Code stroke. Decreased level of consciousness. History of hypertension, severe LEFT Common carotid artery stenosis. EXAM: CT HEAD WITHOUT CONTRAST TECHNIQUE: Contiguous axial images were obtained from the base of the skull through the vertex without intravenous contrast. COMPARISON:  None. FINDINGS: BRAIN: 3.6 x 5.3 cm RIGHT insular dense hematoma surrounding low-density vasogenic edema. Moderate amount of diffuse subarachnoid hemorrhage. Mild mass effect on the RIGHT lateral ventricle, no midline shift. No hydrocephalus. Old LEFT caudate head lacunar infarct. Greater than expected hypodensity LEFT insula. Basal cistern effacement. VASCULAR: Moderate  calcific atherosclerosis of the carotid siphons. SKULL: No skull fracture. No significant scalp soft tissue swelling. SINUSES/ORBITS: The mastoid air-cells and included paranasal sinuses are well-aerated.The included ocular globes and orbital contents are non-suspicious. OTHER: None. ASPECTS (Alberta Stroke Program Early CT Score) - Ganglionic level infarction (caudate, lentiform nuclei, internal capsule, insula, M1-M3 cortex): 6 - Supraganglionic infarction (M4-M6 cortex): 3 Total score (0-10 with 10 being normal): 9 IMPRESSION: 1. 3.6 x 5.3 cm acute RIGHT insular hematoma. Moderate amount of diffuse subarachnoid hemorrhage resulting in basilar cistern effacement. Early suspected LEFT insular infarct. 2. ASPECTS is 9. Acute findings discussed with and reconfirmed by Paralee Cancel PA on 12/14/2015 at 6:50 am. Electronically Signed   By: Awilda Metro M.D.   On: 12/24/2015 06:53       --Wells Guiles, MD.  Board Certified in Internal Medicine, Pulmonary Medicine, Critical Care Medicine, and  Sleep Medicine.  ICU Pager [phone] [facility] Office Number: [phone]  Santiago Glad, M.D.  Stephanie Acre, M.D.  Billy Fischer, M.D   12/19/2015, 7:55 AM  Critical Care Attestation.  I have personally obtained a history, examined the patient, evaluated laboratory and imaging results, formulated the assessment and plan and placed orders. The Patient requires high complexity decision making for assessment and support, frequent evaluation and titration of therapies, application of advanced monitoring technologies and extensive interpretation of multiple databases. The patient has critical illness that could lead imminently to failure of 1 or more organ systems and requires the highest level of physician preparedness to intervene.  Critical Care Time devoted to patient care services described in this note is 45 minutes and is exclusive of time spent in procedures.

## 2016-01-08 NOTE — Progress Notes (Signed)
CDS called. Spoke with April Shore. Pt ruled out at this time.   Reference number [id number]. Request call back with TOD. Will continue to assess.

## 2016-01-08 NOTE — Progress Notes (Addendum)
Called son at [phone]: no answer with voicemail full. Called Zella BallRobin (daughter in law) at [phone] - informed family patient had change in mental status this morning around 6am. STAT CT scan showed hemorraghic stroke. Explained critical care and neurology are seeing patient and will determine if he should be transferred to another hospital. Strongly encouraged family to come to hospital. All questions answered.   Received page at 615am this morning, nursing stating change in mental status now with left facial droop. Asked for stroke rapid response to be called. Patient had STAT CT which showed 5cm right sided hematoma and subarachnoid stroke. All anticoagulation stopped. STAT critical care consult ordered. Will try to reverse with FFP, cryoprecipitate and clotting factors. Awaiting neuro recommendations.  Saw patient at 730am. Minimally responsive to pain. Pupils now unequal. Patient hypertensive over SBP 200's other vitals stable.    Will continue to monitor.  Dr. Wyn Quakerew and Dr. Gilda CreaseSchnier made aware.

## 2016-01-08 NOTE — Progress Notes (Signed)
Called to pronounce the patient. Nurse's verified patient had absence of vital signs @ 4:00 PM today. I examined the patient verified the patient does not have any spontaneous respiratory effort there is no carotid pulse nor cardiac activity. I confirm patient's death and concur with time of death at 4 PM Friday, 01/01/2016

## 2016-01-08 NOTE — Progress Notes (Signed)
Dr. Gilda CreaseSchnier notified of level 1 hematoma (with oozing) around central line insertion at R groin site. Orders to continue to monitor at this time, and to notify if hematoma increases in size (doubles). Patient currently lying flat and teaching reinforced to keep leg straight and no movement. Dressing reinforced, will continue to assess. VSS. Trudee KusterBrandi R Mansfield

## 2016-01-08 NOTE — Progress Notes (Signed)
Discussed case with pt's daughter via phone and son-in-law in person at bedside.  I explained the patient has experienced a hemorrhagic stroke which may require surgical intervention, he will require intubation for life support measures, he will transfer to tertiary facility.  He may not survive these measures, and is likely to be left with significant disability and physical and mental deficits.   Pt's caregiver felt that the patient would not want to be put on life support, and she expressed that she did not want to be put through more than he has already been through.  I suggested that code status be changed to DNR, and we will change to comfort measures only. We will treat the brain bleed with reversal agents but otherwise only provide comfort measures. She understands that he may pass away.     Wells Guiles-Deep Shahab Polhamus, M.D.  Time spent in discussion 30 min.  12/19/2015

## 2016-01-08 NOTE — Progress Notes (Signed)
Inpatient Diabetes Program Recommendations  AACE/ADA: New Consensus Statement on Inpatient Glycemic Control (2015)  Target Ranges:  Prepandial:   less than 140 mg/dL      Peak postprandial:   less than 180 mg/dL (1-2 hours)      Critically ill patients:  140 - 180 mg/dL   Results for Devin Cooley, Devin Cooley (MRN [id number]) as of 01/02/2016 10:48  Ref. Range 12/26/2015 08:00  Glucose Latest Ref Range: 65 - 99 mg/dL 409199 (H)    Review of Glycemic Control  Diabetes history: none documented Outpatient Diabetes medications: none Current orders for Inpatient glycemic control: none  Inpatient Diabetes Program Recommendations:  May want to order A1C and CBG monitoring TIDAC & HS  based on random glucose of 199 mg/dL.  Thank you,  Kristine LineaKaren Makilah Dowda, RN, BSN Diabetes Coordinator Inpatient Diabetes Program [phone] (Team Pager) [phone] ([facility] office) [phone] [facility] office) [phone] [facility] office)

## 2016-01-08 NOTE — Progress Notes (Signed)
[facility]  Daily Progress Note   Subjective  - 1 Day Post-Op  About 6:15 this am became lethargic and less responsive with facial droop and weakness present.  Aphasia as well. Anticoagulants stopped and stat CT shows head bleed.  Respirations more labored.  Son at bedside and difficult situation discussed with him.  He is awaiting other family members to determine whether or not they would like transfer to center with neurosurgeon for possible evacuation.  Objective Vitals:   12/29/2015 0745 12/16/2015 0800 12/22/2015 0815 12/30/2015 0830  BP: (!) 209/108 (!) 197/103 (!) 183/89 (!) 178/98  Pulse: 78 78 76 76  Resp: (!) 24 (!) 22 (!) 26 (!) 26  Temp:  98.8 F (37.1 C)    TempSrc:  Oral    SpO2: 100% 99% 99% 100%  Weight:      Height:        Intake/Output Summary (Last 24 hours) at 01/02/2016 0851 Last data filed at 12/12/2015 0500  Gross per 24 hour  Intake           1228.8 ml  Output              200 ml  Net           1028.8 ml    PULM  Respirations labored CV  RRR VASC  Left foot cool, no pulses noted NEURO not responsive, pupils unequal.   Laboratory CBC    Component Value Date/Time   WBC 8.8 01/05/2016 0520   HGB 9.5 (L) 12/13/2015 0520   HGB 5.3 (L) 07/11/2014 0919   HCT 27.8 (L) 01/05/2016 0520   HCT 17.0 (L) 07/11/2014 0919   PLT 114 (L) 12/22/2015 0520   PLT 145 (L) 07/11/2014 0919    BMET    Component Value Date/Time   NA 134 (L) 12/18/2015 0800   NA 133 (L) 07/11/2014 0919   K 4.3 12/15/2015 0800   K 4.8 07/11/2014 0919   CL 105 12/15/2015 0800   CL 110 07/11/2014 0919   CO2 19 (L) 01/03/2016 0800   CO2 11 (L) 07/11/2014 0919   GLUCOSE 199 (H) 01/05/2016 0800   GLUCOSE 256 (H) 07/11/2014 0919   BUN 29 (H) 12/24/2015 0800   BUN 91 (H) 07/11/2014 0919   CREATININE 1.37 (H) 12/16/2015 0800   CREATININE 3.11 (H) 07/11/2014 0919   CALCIUM 9.3 12/30/2015 0800   CALCIUM 8.3 (L) 07/11/2014 0919   GFRNONAA 46 (L) 12/24/2015 0800   GFRNONAA 18 (L) 07/11/2014 0919   GFRAA 53 (L) 12/30/2015 0800   GFRAA 21 (L) 07/11/2014 0919    Assessment/Planning: POD #1 s/p LLE angiogram with thrombolysis   [facility]  Anticoagulants stopped. Awaiting family decision on transfer for neurosurgical evaluation  LLE ischemia would likely require amputation at some point if he survives.  Very poor prognosis    Tiann Saha  12/19/2015, 8:51 AM

## 2016-01-08 NOTE — Progress Notes (Signed)
Chaplain rounded the unit to provide a compassionate presence and support to the patient.  The patient appeared to be sleeping while the room was filled with family. Jefm PettyChaplain Uchechukwu Dhawan [phone]

## 2016-01-08 NOTE — Progress Notes (Signed)
Patient noted with cessation of respirations, no heart tones heard, second verification by d. heslep RN. Cleda DaubKimberly Stegmayer PA notiifed of patient condition and patient is not a Charity fundraiserN to [employer] Corporationpronouce, Orthoptistchaplain notified, family at bedside support given.

## 2016-01-08 NOTE — Progress Notes (Signed)
Patient started to have mental status and blood pressure changes at 0545/0600. Neuro assessment completed, pupils equal (3), reactive, and brisk.Patient unable to grip on L side. L sided facial droop observed. Vascular PA (Stegmayer) paged and notified of changes. Code stroke called and STAT CT obtained. [facility]. TPA and heparin stopped, metoprolol IV given per order obtained from Dr. Dema SeverinMungal. Orders obtained to order STAT critical care consult, Dr. Nicholos Johnsamachandran on unit and updated on condition. Stegmayer arrived on unit at 0730, Dr. Wyn Quakerew notified of all changes also. Family notified by vascular surgery. Trudee KusterBrandi R Mansfield

## 2016-01-08 NOTE — Discharge Summary (Signed)
  [facility]    Discharge Summary   Patient ID:  Devin FeeJuan Cooley MRN: [id number] DOB/AGE: [birth year] 82 y.o.  Admit date: 12/15/2015 Discharge date: 01/05/2016 Date of Surgery: 12/20/2015 Surgeon: Surgeon(s): Annice NeedyJason S Dew, MD  Admission Diagnosis: PVD with Rest Pain Lt Lower Extr    NOTE;  LYTIC CATHETER  TPA WILL RUN THROUGH NIGHT PER LAURA AT OFICE ischemic leg Left Lower Extr PVD  Cannot put in as In pt if pt has not been advmited per note:js  Discharge Diagnoses:  PVD with Rest Pain Lt Lower Extr    NOTE;  LYTIC CATHETER  TPA WILL RUN THROUGH NIGHT PER LAURA AT OFICE ischemic leg Left Lower Extr PVD  Cannot put in as In pt if pt has not been advmited per note:js  Secondary Diagnoses: Past Medical History:  Diagnosis Date  . H/O: GI bleed    unknown date  . Hyperlipidemia   . Hypertension   . PAD (peripheral artery disease) (HCC)    unknown date   Procedure(s): Lower Extremity Angiography  Discharged Condition: Deceased  HPI:  The patient was a 82 year old male with rest pain of the left leg. He had an acute on chronic worsening of his symptoms after multiple previous interventions. The patient had noninvasive study showing flat waveforms on the left with essentially no flow distally. On 01/07/2016, the patient underwent ultrasound guidance for vascular access right femoral artery, catheter placement into left anterior tibial artery from right femoral approach, aortogram and selective left lower extremity angiogram, catheter directed thrombolytic therapy with 8 mg of TPA instilled with the AngioJet Omni catheter and a power pulse spray fashion to the left common femoral artery, SFA, popliteal artery, and anterior tibial arteries, mechanical rheolytic thrombectomy with the AngioJet on the catheter to the left common femoral artery, SFA, popliteal artery, and anterior tibial artery, percutaneous transluminal angioplasty of the left anterior tibial artery 3 mm  diameter angioplasty balloon, with percutaneous transluminal angioplasty of the left popliteal artery, SFA, and common femoral artery using 5 mm diameter angioplasty balloon in the mid and distal segments and a 6 mm diameter angioplasty balloon in the common femoral artery and proximal SFA including drug coated balloons proximally and distally. He tolerated the procedure well and was transferred to the ICU overnight for TPA and heparin infusion.   On the morning of 12/21/2015, the patient experienced a change in mental status becoming unresponsive with nurse noting a left facial droop. A STAT CT of the head was completed and notable for  3.6 x 5.3cm acute RIGHT insular hematoma. Moderate amount of diffuse subarachnoid hemorrhage resulting in basilar cistern effacement. Early suspected LEFT insular infarct. All anticoagulation was stopped. Critical care consulted.   After discussion with family, patient was made comfort care and expired at 4:oopm on 12/16/2015.  Consults:  Critical Care  Dis[facility]:  [facility]  Signed: Tonette LedererKIMBERLY A Adamari Frede, PA-C  01/05/2016, 4:38 PM

## 2016-01-08 DEATH — deceased

## 2016-02-08 DEATH — deceased

## 2017-06-07 IMAGING — CT CT HEAD CODE STROKE
3 of 6 series · 15 of 47 positions shown, 18 images · non-contrast
Comparison: None.

CLINICAL DATA: Code stroke. Decreased level of consciousness.
History of hypertension, severe LEFT Common carotid artery stenosis.

EXAM:
CT HEAD WITHOUT CONTRAST
TECHNIQUE: Contiguous axial images were obtained from the base of the skull
through the vertex without intravenous contrast.

[Series 3: ax head wo-- · axial · 0.41mm/px · z∈[-85,+35]mm · 10 of 28 slices shown, 13 images]
[im 2/28  brain]
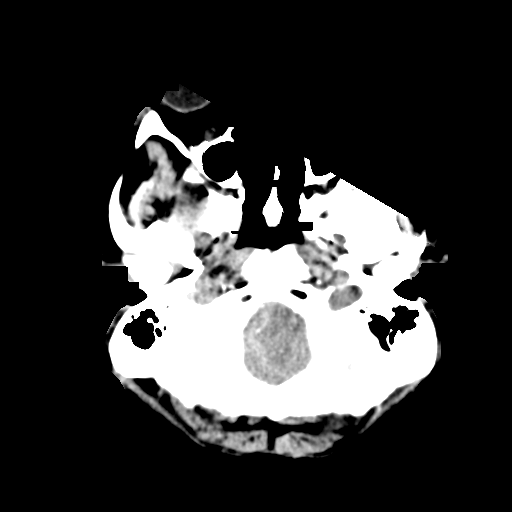
[im 2/28  bone]
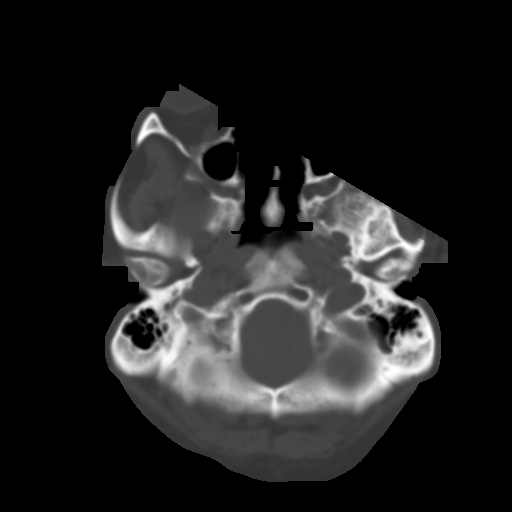
[im 4/28  brain]
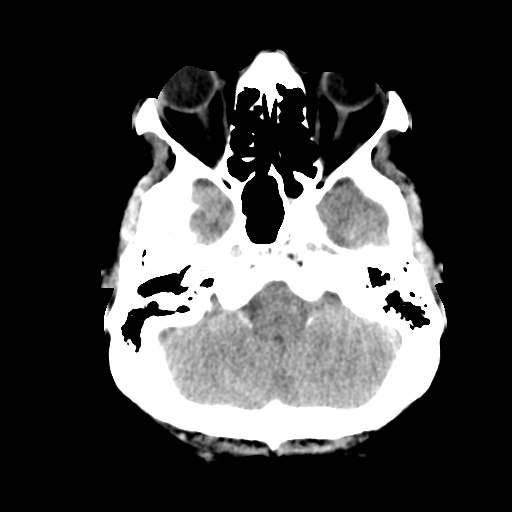
[im 8/28  brain]
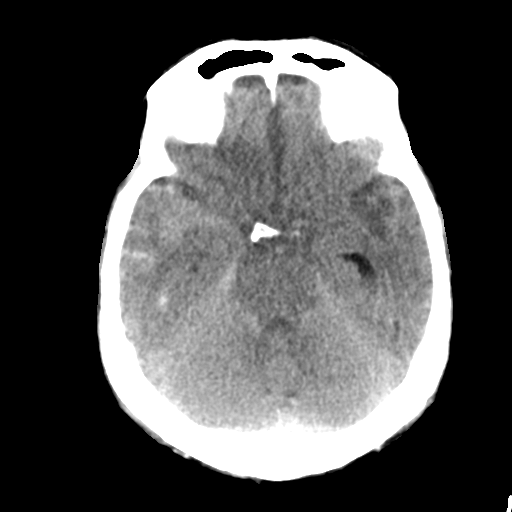
[im 10/28  brain]
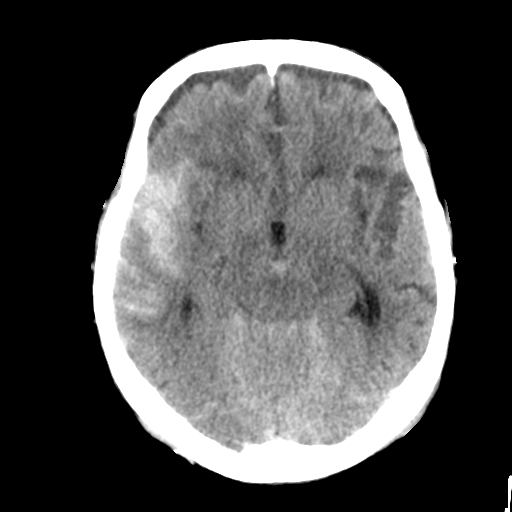
[im 12/28  brain]
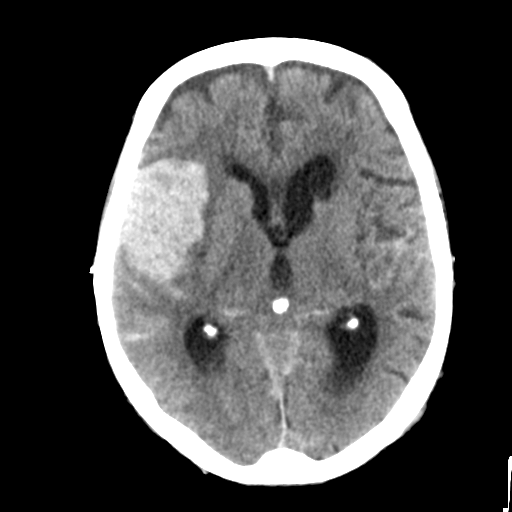
[im 12/28  bone]
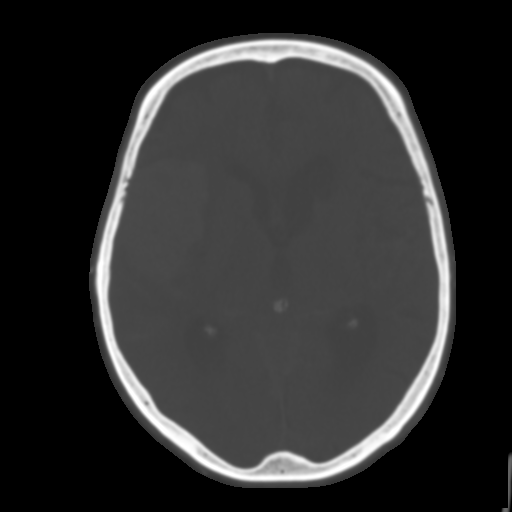
[im 16/28  brain]
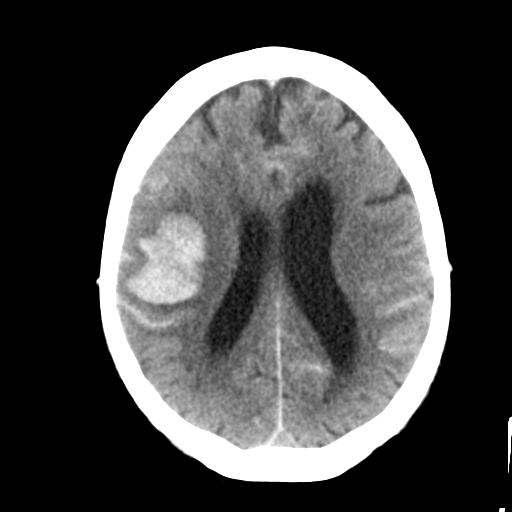
[im 18/28  brain]
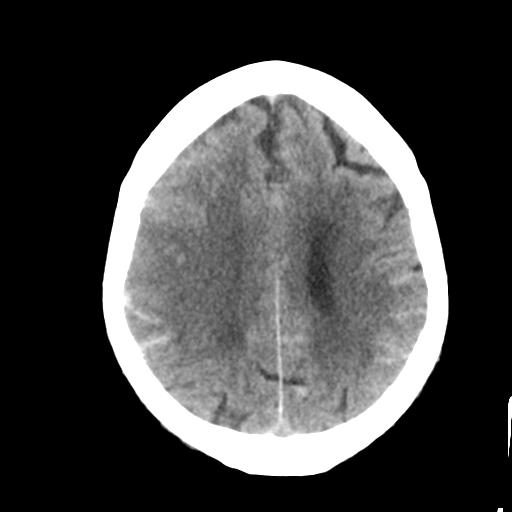
[im 20/28  brain]
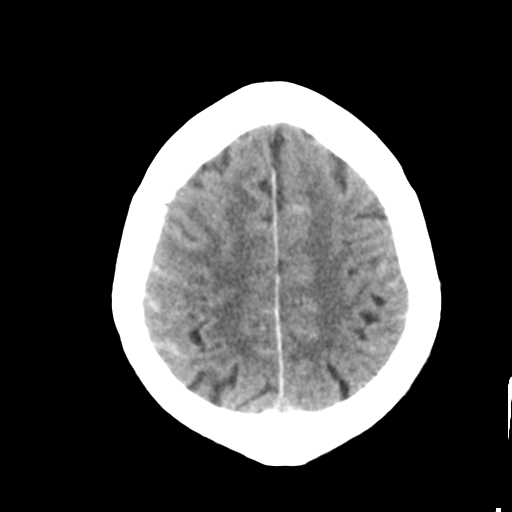
[im 24/28  brain]
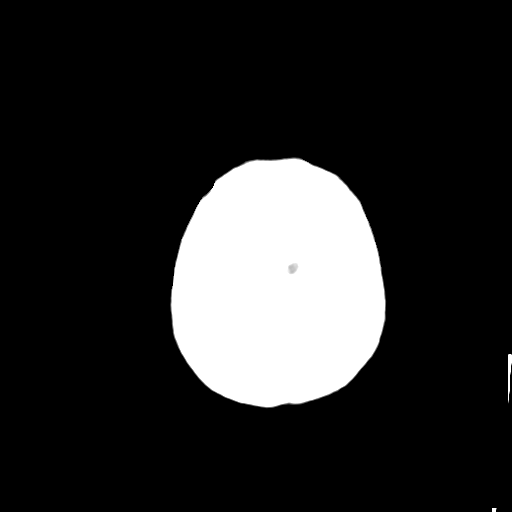
[im 24/28  bone]
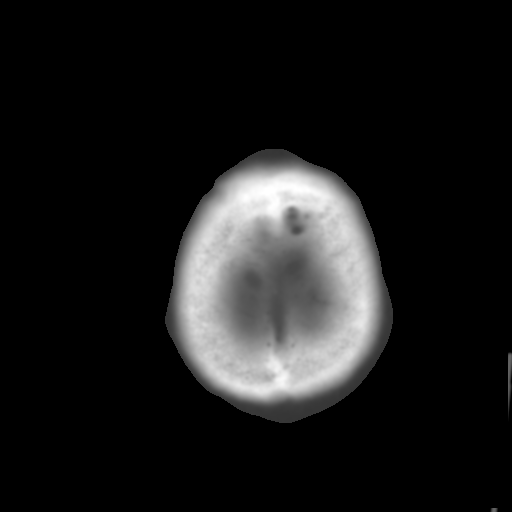
[im 26/28  brain]
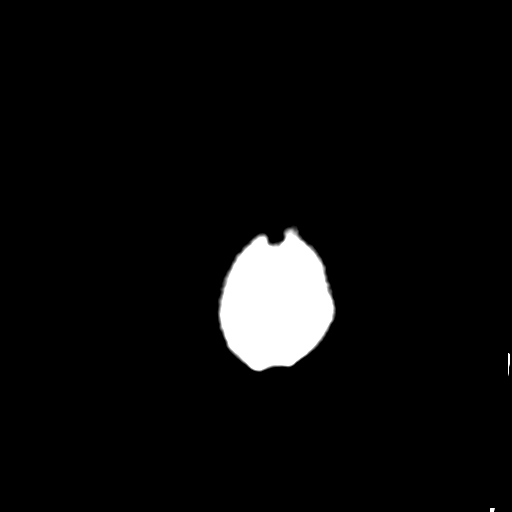

[Series 5: coronal soft tissue · coronal · 0.29mm/px · 3 of 62 slices shown]
[im 16/62  brain]
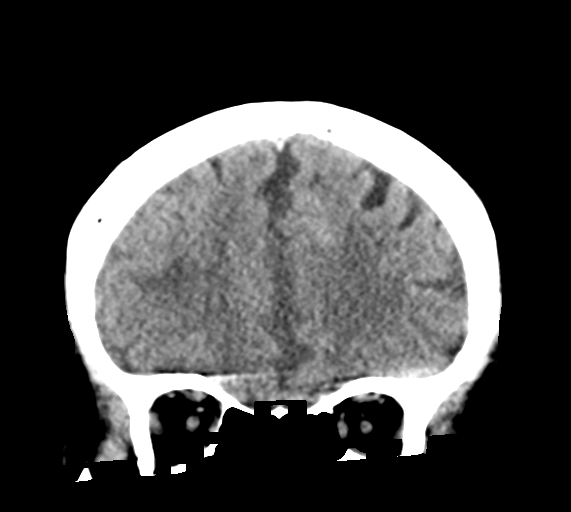
[im 31/62  brain]
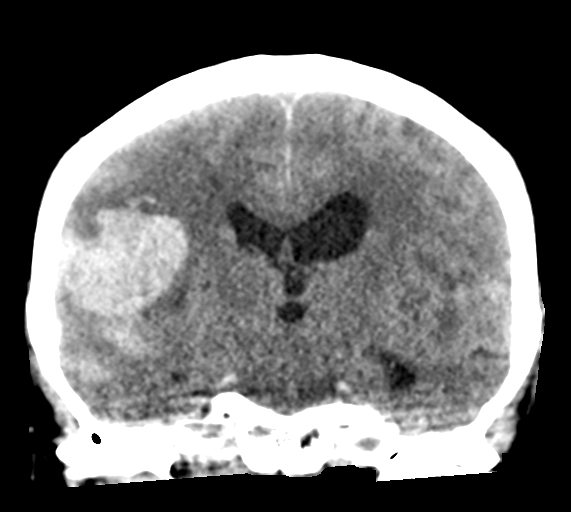
[im 46/62  brain]
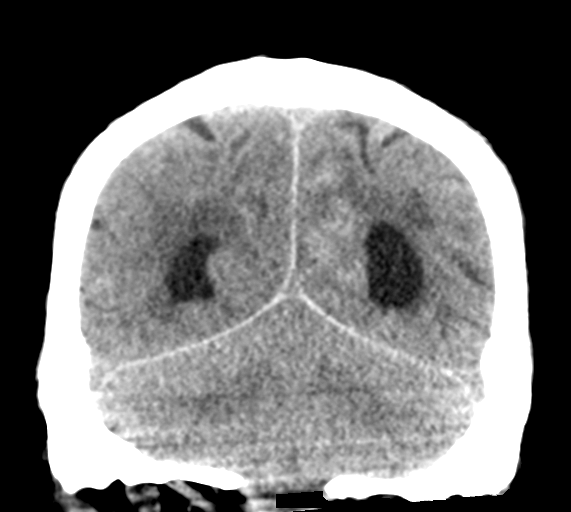

[Series 6: sagittal soft tissue-- · sagittal · 0.31mm/px · 2 of 52 slices shown]
[im 18/52  brain]
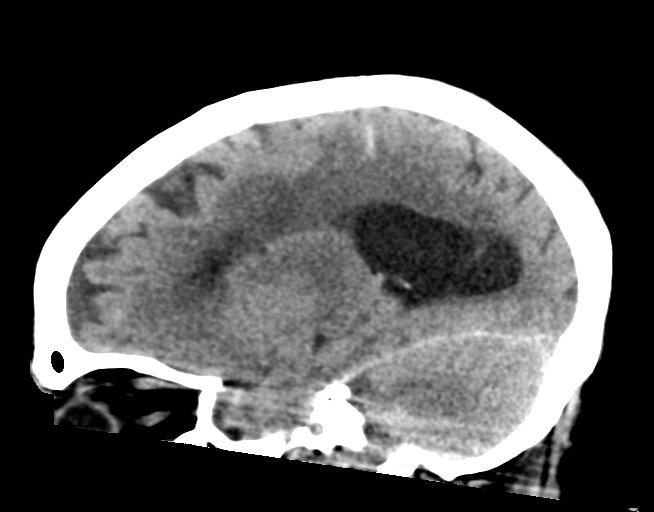
[im 35/52  brain]
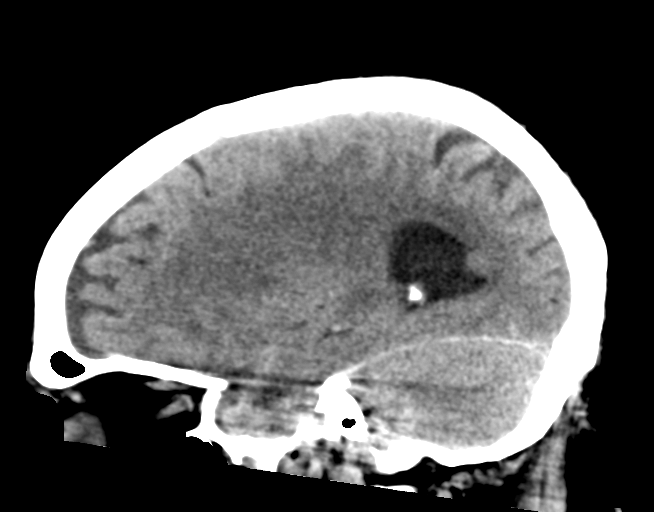

[15 of 47 positions shown; findings below may reference images not displayed]

FINDINGS: BRAIN: 3.6 x 5.3 cm RIGHT insular dense hematoma surrounding
low-density vasogenic edema. Moderate amount of diffuse subarachnoid
hemorrhage. Mild mass effect on the RIGHT lateral ventricle, no
midline shift. No hydrocephalus. Old LEFT caudate head lacunar
infarct. Greater than expected hypodensity LEFT insula. Basal
cistern effacement.

VASCULAR: Moderate calcific atherosclerosis of the carotid siphons.

SKULL: No skull fracture. No significant scalp soft tissue swelling.

SINUSES/ORBITS: The mastoid air-cells and included paranasal sinuses
are well-aerated.The included ocular globes and orbital contents are
non-suspicious.

OTHER: None.

ASPECTS (Alberta Stroke Program Early CT Score)

- Ganglionic level infarction (caudate, lentiform nuclei, internal
capsule, insula, M1-M3 cortex): 6

- Supraganglionic infarction (M4-M6 cortex): 3

Total score (0-10 with 10 being normal): 9
IMPRESSION: 1. 3.6 x 5.3 cm acute RIGHT insular hematoma. Moderate amount of
diffuse subarachnoid hemorrhage resulting in basilar cistern
effacement.

Early suspected LEFT insular infarct.

2. ASPECTS is 9.

Acute findings discussed with and reconfirmed by Dorinda Rawal PA on
01/06/2016 at [DATE].

## 2017-11-21 IMAGING — US US EXTREM LOW VENOUS*L*
1 series · 13 of 24 positions shown · non-contrast
Comparison: None.

CLINICAL DATA: 85-year-old male with left leg pain. History of
prior DVT.



[Series 1: us extrem low venous*left* · 0.07mm/px · 13 of 43 slices shown]
[im 1/43]
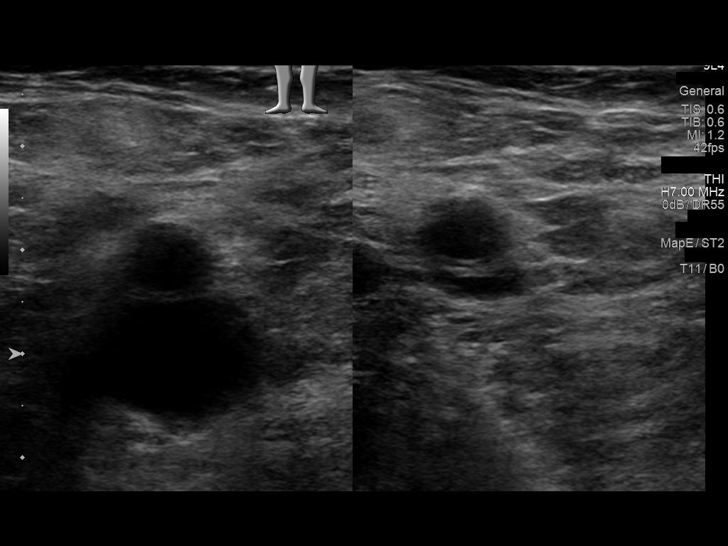
[im 4/43]
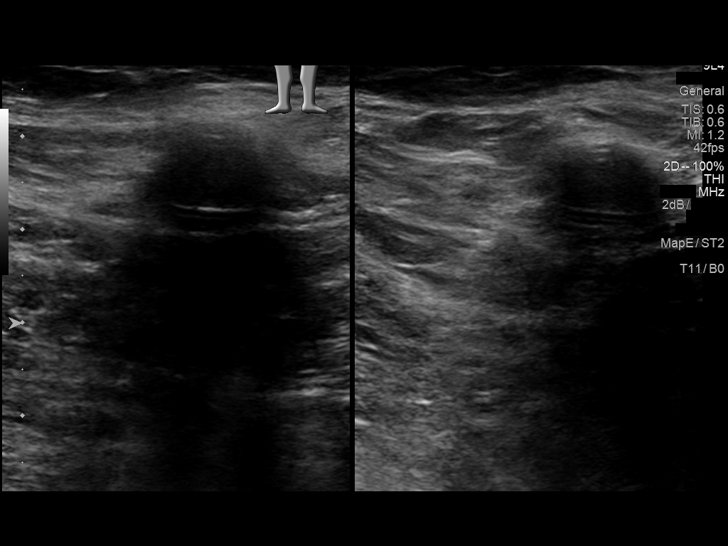
[im 8/43]
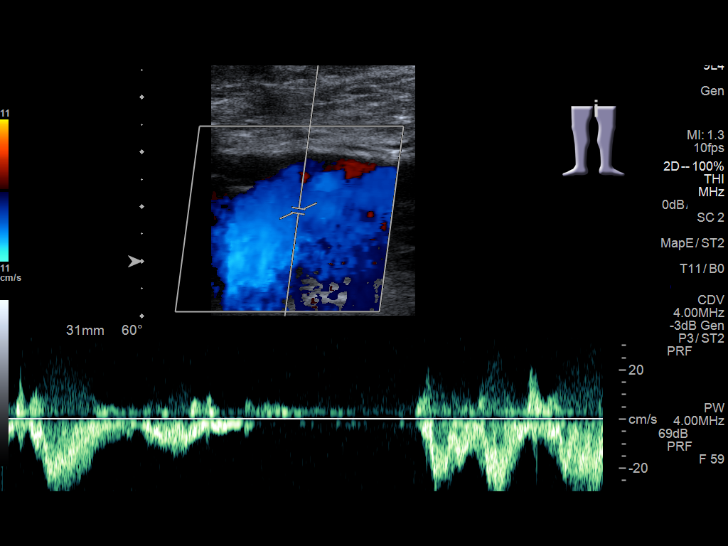
[im 11/43]
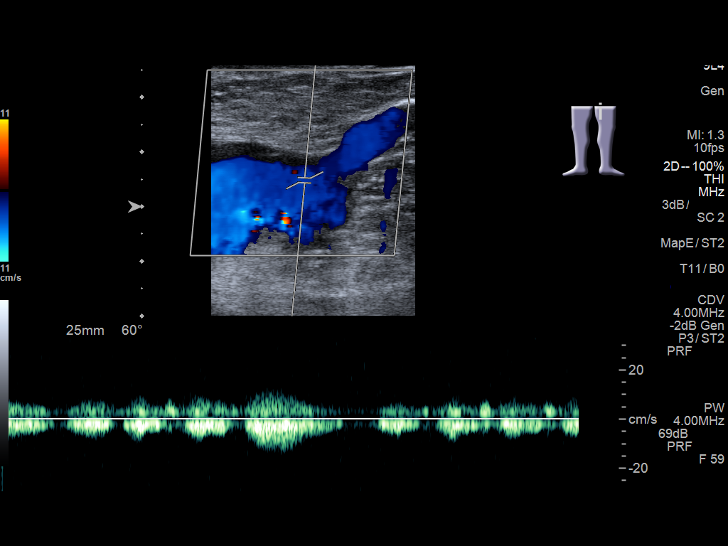
[im 15/43]
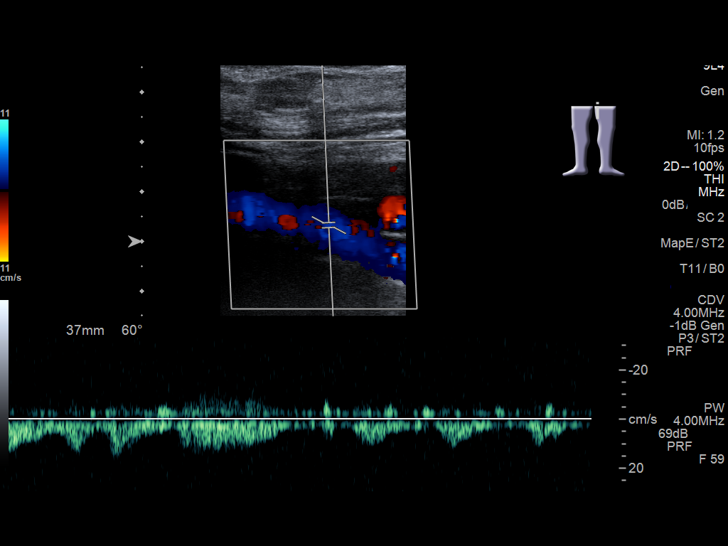
[im 19/43]
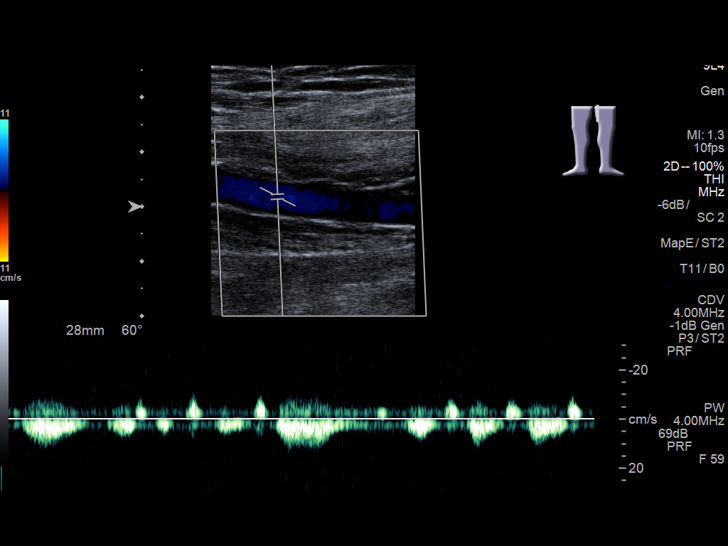
[im 22/43]
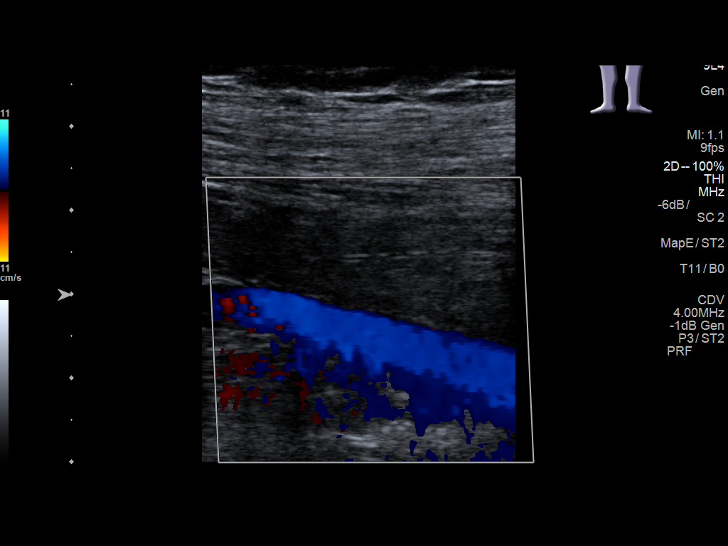
[im 24/43]
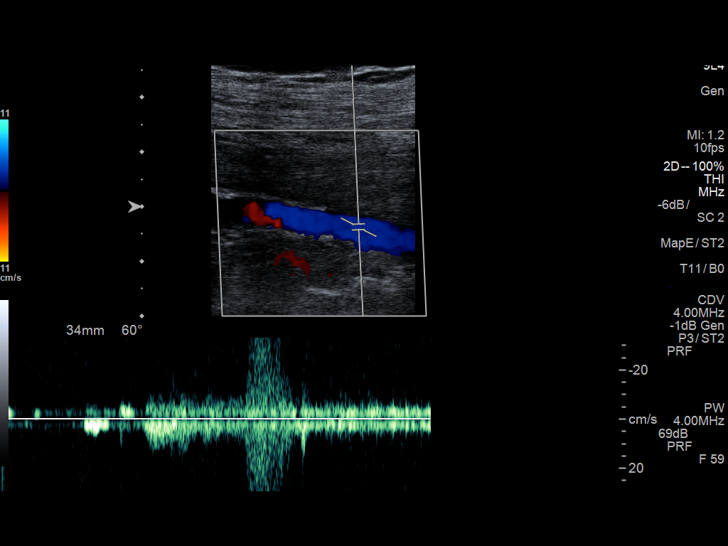
[im 28/43]
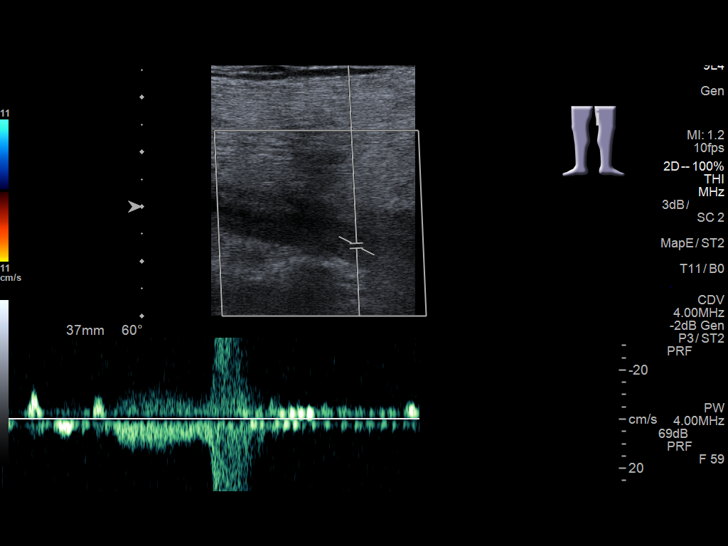
[im 32/43]
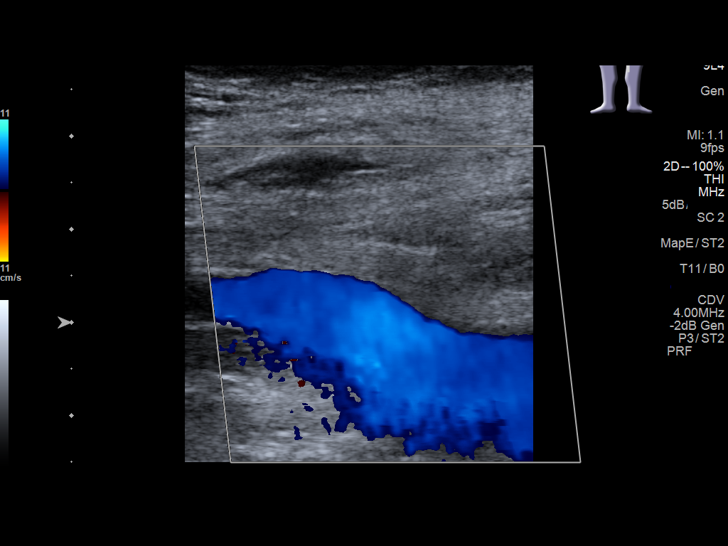
[im 35/43]
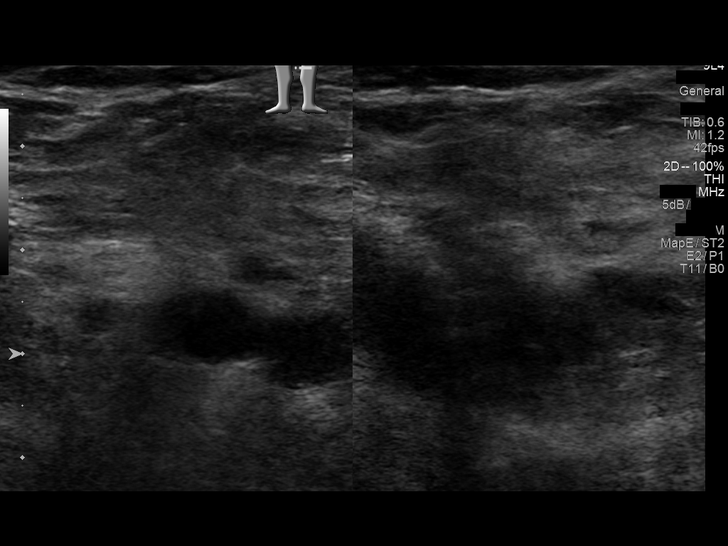
[im 39/43]
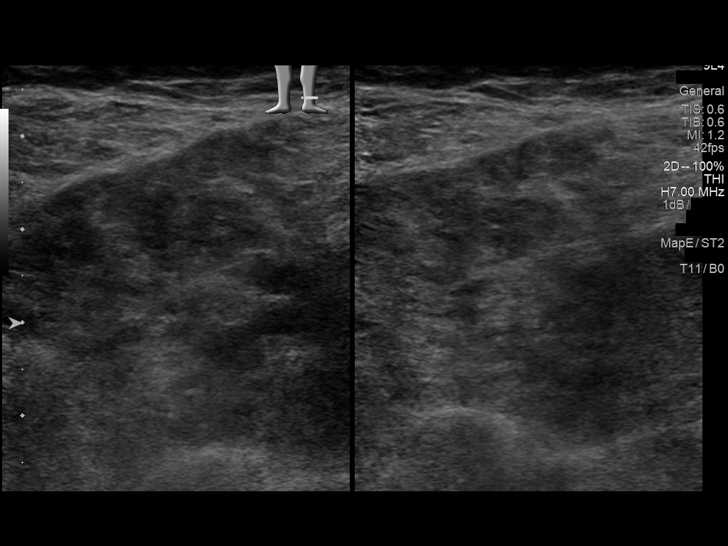
[im 43/43]
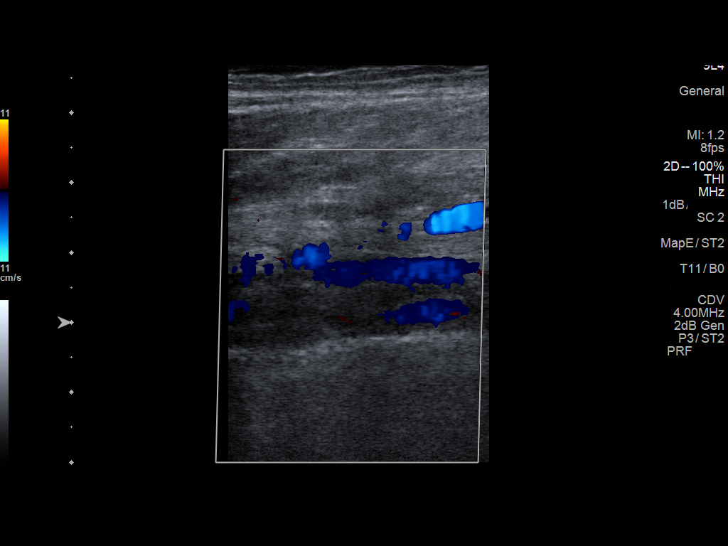

[13 of 24 positions shown; findings below may reference images not displayed]

FINDINGS: Contralateral Common Femoral Vein: Respiratory phasicity is normal
and symmetric with the symptomatic side. No evidence of thrombus.
Normal compressibility.

Common Femoral Vein: No evidence of thrombus. Normal
compressibility, respiratory phasicity and response to augmentation.

Saphenofemoral Junction: No evidence of thrombus. Normal
compressibility and flow on color Doppler imaging.

Profunda Femoral Vein: No evidence of thrombus. Normal
compressibility and flow on color Doppler imaging.

Femoral Vein: No evidence of thrombus. Normal compressibility,
respiratory phasicity and response to augmentation.

Popliteal Vein: No evidence of thrombus. Normal compressibility,
respiratory phasicity and response to augmentation.

Calf Veins: The visualized calf veins appear patent.

Superficial Great Saphenous Vein: No evidence of thrombus. Normal
compressibility and flow on color Doppler imaging.

Venous Reflux:  None.

Other Findings:  None.
IMPRESSION: No evidence of deep venous thrombosis in the left lower extremity.
